# Patient Record
Sex: Female | Born: 2000 | Race: Black or African American | Hispanic: No | Marital: Single | Smoking: Never smoker
Health system: Southern US, Community
[De-identification: ages and names within clinical notes are randomized; demographics above are authoritative.]

## PROBLEM LIST (undated history)

## (undated) DIAGNOSIS — I10 Essential (primary) hypertension: Secondary | ICD-10-CM

## (undated) DIAGNOSIS — E039 Hypothyroidism, unspecified: Secondary | ICD-10-CM

## (undated) DIAGNOSIS — Z789 Other specified health status: Secondary | ICD-10-CM

## (undated) DIAGNOSIS — A609 Anogenital herpesviral infection, unspecified: Secondary | ICD-10-CM

## (undated) HISTORY — DX: Essential (primary) hypertension: I10

## (undated) HISTORY — PX: WISDOM TOOTH EXTRACTION: SHX21

## (undated) HISTORY — DX: Other specified health status: Z78.9

## (undated) HISTORY — PX: NO PAST SURGERIES: SHX2092

## (undated) HISTORY — DX: Hypothyroidism, unspecified: E03.9

## (undated) HISTORY — DX: Anogenital herpesviral infection, unspecified: A60.9

---

## 2020-10-18 ENCOUNTER — Encounter (HOSPITAL_COMMUNITY): Payer: Self-pay | Admitting: Emergency Medicine

## 2020-10-18 ENCOUNTER — Emergency Department (HOSPITAL_COMMUNITY): Payer: BC Managed Care – PPO

## 2020-10-18 ENCOUNTER — Emergency Department (HOSPITAL_COMMUNITY)
Admission: EM | Admit: 2020-10-18 | Discharge: 2020-10-18 | Disposition: A | Discharge status: Home / Self Care | Payer: BC Managed Care – PPO | Attending: Emergency Medicine | Admitting: Emergency Medicine

## 2020-10-18 DIAGNOSIS — N72 Inflammatory disease of cervix uteri: Secondary | ICD-10-CM | POA: Diagnosis not present

## 2020-10-18 DIAGNOSIS — N760 Acute vaginitis: Secondary | ICD-10-CM | POA: Insufficient documentation

## 2020-10-18 DIAGNOSIS — R102 Pelvic and perineal pain: Secondary | ICD-10-CM

## 2020-10-18 DIAGNOSIS — B9689 Other specified bacterial agents as the cause of diseases classified elsewhere: Secondary | ICD-10-CM

## 2020-10-18 LAB — URINALYSIS, ROUTINE W REFLEX MICROSCOPIC
Bilirubin Urine: NEGATIVE
Glucose, UA: NEGATIVE mg/dL
Hgb urine dipstick: NEGATIVE
Ketones, ur: NEGATIVE mg/dL
Nitrite: NEGATIVE
Protein, ur: NEGATIVE mg/dL
Specific Gravity, Urine: 1.021 (ref 1.005–1.030)
pH: 8 (ref 5.0–8.0)

## 2020-10-18 LAB — WET PREP, GENITAL
Sperm: NONE SEEN
Trich, Wet Prep: NONE SEEN
Yeast Wet Prep HPF POC: NONE SEEN

## 2020-10-18 LAB — PREGNANCY, URINE: Preg Test, Ur: NEGATIVE

## 2020-10-18 MED ORDER — DOXYCYCLINE HYCLATE 100 MG PO CAPS: 100.0000 mg | ORAL_CAPSULE | Freq: Two times a day (BID) | ORAL | 0 refills | Status: AC

## 2020-10-18 MED ORDER — METRONIDAZOLE 0.75 % VA GEL: 1.0000 | Freq: Once | VAGINAL | 0 refills | Status: AC

## 2020-10-18 MED ORDER — DOXYCYCLINE HYCLATE 100 MG PO TABS
100.0000 mg | ORAL_TABLET | Freq: Once | ORAL | Status: AC
  Administered 2020-10-18: 100 mg via ORAL
  Filled 2020-10-18: qty 1

## 2020-10-18 MED ORDER — STERILE WATER FOR INJECTION IJ SOLN
INTRAMUSCULAR | Status: AC
  Administered 2020-10-18: 2.1 mL
  Filled 2020-10-18: qty 10

## 2020-10-18 MED ORDER — CEFTRIAXONE SODIUM 1 G IJ SOLR
1.0000 g | Freq: Once | INTRAMUSCULAR | Status: AC
  Administered 2020-10-18: 1 g via INTRAMUSCULAR
  Filled 2020-10-18: qty 10

## 2020-10-18 NOTE — ED Triage Notes (Signed)
Patient reports pelvic pain x3 weeks. Denies vaginal discharge and vaginal bleeding. States pain mimics menstrual pain but has persisted after LMP.

## 2020-10-18 NOTE — ED Provider Notes (Signed)
[facility]-EMERGENCY DEPT Provider Note   CSN: [id number] Arrival date & time: 10/18/20  1425     History Chief Complaint  Patient presents with  . Pelvic Pain    Bethany Pierce is a 20 y.o. female.  She complains of central, suprapubic pain and left right and right low back pain.  She states that she gets some relief after urination.  No vaginal discharge.  She is sexually active, and she was diagnosed with chlamydia a few months ago.  She has had a retest which was negative.  The history is provided by the patient.  Pelvic Pain This is a new problem. The current episode started more than 1 week ago (3 weeks). The problem occurs constantly. The problem has been gradually worsening. Associated symptoms include abdominal pain. Pertinent negatives include no chest pain, no headaches and no shortness of breath. Nothing aggravates the symptoms. Relieved by: urination. She has tried nothing for the symptoms. The treatment provided no relief.       History reviewed. No pertinent past medical history.  There are no problems to display for this patient.   History reviewed. No pertinent surgical history.   OB History   No obstetric history on file.     No family history on file.     Home Medications Prior to Admission medications   Not on File    Allergies    Patient has no known allergies.  Review of Systems   Review of Systems  Constitutional: Negative for chills and fever.  HENT: Negative for ear pain and sore throat.   Eyes: Negative for pain and visual disturbance.  Respiratory: Negative for cough and shortness of breath.   Cardiovascular: Negative for chest pain and palpitations.  Gastrointestinal: Positive for abdominal pain. Negative for vomiting.  Genitourinary: Positive for pelvic pain. Negative for dysuria and hematuria.  Musculoskeletal: Negative for arthralgias and back pain.  Skin: Negative for color change and rash.  Neurological:  Negative for seizures, syncope and headaches.  All other systems reviewed and are negative.   Physical Exam Updated Vital Signs BP (!) 154/101 (BP Location: Left Wrist)   Pulse 86   Temp 98.3 F (36.8 C) (Oral)   Resp 20   LMP 10/11/2020   SpO2 99%   Physical Exam Vitals and nursing note reviewed.  HENT:     Head: Normocephalic and atraumatic.  Eyes:     General: No scleral icterus. Pulmonary:     Effort: Pulmonary effort is normal. No respiratory distress.  Abdominal:     Tenderness: There is abdominal tenderness in the suprapubic area. There is no right CVA tenderness, left CVA tenderness, guarding or rebound.  Genitourinary:    General: Normal vulva.     Vagina: Vaginal discharge (white) present.     Comments: Mild cervical tenderness and central uterine tenderness Musculoskeletal:     Cervical back: Normal range of motion.  Skin:    General: Skin is warm and dry.  Neurological:     Mental Status: She is alert.  Psychiatric:        Mood and Affect: Mood normal.     ED Results / Procedures / Treatments   Labs (all labs ordered are listed, but only abnormal results are displayed) Labs Reviewed  WET PREP, GENITAL - Abnormal; Notable for the following components:      Result Value   Clue Cells Wet Prep HPF POC PRESENT (*)    WBC, Wet Prep HPF POC PRESENT (*)  All other components within normal limits  URINALYSIS, ROUTINE W REFLEX MICROSCOPIC - Abnormal; Notable for the following components:   APPearance HAZY (*)    Leukocytes,Ua MODERATE (*)    Bacteria, UA RARE (*)    All other components within normal limits  PREGNANCY, URINE  GC/CHLAMYDIA PROBE AMP ([place]) NOT AT [facility]    EKG None  Radiology US Transvaginal Non-OB  Result Date: 10/18/2020 CLINICAL DATA:  Pelvic pain for several weeks. EXAM: TRANSABDOMINAL AND TRANSVAGINAL ULTRASOUND OF PELVIS TECHNIQUE: Both transabdominal and transvaginal ultrasound examinations of the pelvis were performed.  Transabdominal technique was performed for global imaging of the pelvis including uterus, ovaries, adnexal regions, and pelvic cul-de-sac. It was necessary to proceed with endovaginal exam following the transabdominal exam to visualize the uterus, endometrium, bilateral ovaries and bilateral adnexa. COMPARISON:  None FINDINGS: Uterus Measurements: 7.9 cm x 3.3 cm x 4.1 cm = volume: 54.9 mL. No fibroids or other mass visualized. Endometrium Thickness: 3.2 mm.  No focal abnormality visualized. Right ovary Measurements: 2.1 cm x 1.9 cm x 2.0 cm = volume: 4.1 mL. Normal appearance/no adnexal mass. Left ovary The left ovary is not visualized. Other findings The study is limited secondary to the patient's body habitus and overlying bowel gas. IMPRESSION: 1. Limited study with nonvisualization of the left ovary. 2. Otherwise, unremarkable ultrasonographic evaluation of the pelvis. Electronically Signed   By: Aram Candela M.D.   On: 10/18/2020 19:41   US Pelvis Complete  Result Date: 10/18/2020 CLINICAL DATA:  Pelvic pain for several weeks. EXAM: TRANSABDOMINAL AND TRANSVAGINAL ULTRASOUND OF PELVIS TECHNIQUE: Both transabdominal and transvaginal ultrasound examinations of the pelvis were performed. Transabdominal technique was performed for global imaging of the pelvis including uterus, ovaries, adnexal regions, and pelvic cul-de-sac. It was necessary to proceed with endovaginal exam following the transabdominal exam to visualize the uterus, endometrium, bilateral ovaries and bilateral adnexa. COMPARISON:  None FINDINGS: Uterus Measurements: 7.9 cm x 3.3 cm x 4.1 cm = volume: 54.9 mL. No fibroids or other mass visualized. Endometrium Thickness: 3.2 mm.  No focal abnormality visualized. Right ovary Measurements: 2.1 cm x 1.9 cm x 2.0 cm = volume: 4.1 mL. Normal appearance/no adnexal mass. Left ovary The left ovary is not visualized. Other findings The study is limited secondary to the patient's body habitus and  overlying bowel gas. IMPRESSION: 1. Limited study with nonvisualization of the left ovary. 2. Otherwise, unremarkable ultrasonographic evaluation of the pelvis. Electronically Signed   By: Aram Candela M.D.   On: 10/18/2020 19:40    Procedures Procedures   Medications Ordered in ED Medications - No data to display  ED Course  I have reviewed the triage vital signs and the nursing notes.  Pertinent labs & imaging results that were available during my care of the patient were reviewed by me and considered in my medical decision making (see chart for details).    MDM Rules/Calculators/A&P                          The patient is well-appearing and otherwise healthy.  She presents with pelvic pain and low back pain ongoing for about 3 weeks.  She has a history of chlamydia.  Abdominal exam was overall very benign.  Pelvic ultrasound was performed to identify any evidence of pelvic inflammatory disease or much less likely ovarian torsion.  Although her left ovary was not visualized, I am unconcerned about torsion on the side.  Pain was more  central.  The patient has an equivocal urinalysis, and at this point I have not elected to treat her.  She had mild cervical motion tenderness, and based on her history of having chlamydia, I wanted to empirically treat her for cervicitis.  I have also recommended treatment for bacterial vaginitis.  She was given instructions on abstaining sexual activity until she is fully treated and having a recheck in 3 months. Final Clinical Impression(s) / ED Diagnoses Final diagnoses:  Pelvic pain in female  BV (bacterial vaginosis)  Cervicitis    Rx / DC Orders ED Discharge Orders         Ordered    metroNIDAZOLE (METROGEL VAGINAL) 0.75 % vaginal gel   Once        10/18/20 2003    doxycycline (VIBRAMYCIN) 100 MG capsule  2 times daily        10/18/20 2003           Koleen Distance, MD 10/18/20 2007

## 2020-10-20 LAB — GC/CHLAMYDIA PROBE AMP (~~LOC~~) NOT AT ARMC
Chlamydia: POSITIVE — AB
Comment: NEGATIVE
Comment: NORMAL
Neisseria Gonorrhea: NEGATIVE

## 2021-03-06 ENCOUNTER — Encounter (HOSPITAL_COMMUNITY): Payer: Self-pay

## 2021-03-06 ENCOUNTER — Emergency Department (HOSPITAL_COMMUNITY)
Admission: EM | Admit: 2021-03-06 | Discharge: 2021-03-07 | Discharge status: Against Medical Advice | Payer: BC Managed Care – PPO | Attending: Emergency Medicine | Admitting: Emergency Medicine

## 2021-03-06 ENCOUNTER — Other Ambulatory Visit: Payer: Self-pay

## 2021-03-06 ENCOUNTER — Telehealth: Payer: Self-pay

## 2021-03-06 DIAGNOSIS — R102 Pelvic and perineal pain: Secondary | ICD-10-CM | POA: Diagnosis not present

## 2021-03-06 DIAGNOSIS — R1110 Vomiting, unspecified: Secondary | ICD-10-CM | POA: Diagnosis not present

## 2021-03-06 DIAGNOSIS — Z5321 Procedure and treatment not carried out due to patient leaving prior to being seen by health care provider: Secondary | ICD-10-CM | POA: Diagnosis not present

## 2021-03-06 DIAGNOSIS — N898 Other specified noninflammatory disorders of vagina: Secondary | ICD-10-CM | POA: Diagnosis not present

## 2021-03-06 NOTE — ED Triage Notes (Signed)
Patient c/o pelvic pain, emesis, yellow/white vaginal discharge, and a vaginal odor x 1 week.

## 2021-03-06 NOTE — ED Notes (Signed)
Pt called to update vital signs with no answer. 

## 2021-03-06 NOTE — ED Provider Notes (Signed)
Emergency Medicine Provider Triage Evaluation Note  Bethany Pierce , a 20 y.o. female  was evaluated in triage.  Pt complains of pelvic pain.  Review of Systems  Positive: Pelvic pain, vaginal discharge and odor, nausea Negative: Fever, abd pain, dysuria  Physical Exam  BP (!) 156/101 (BP Location: Left Arm)   Pulse (!) 107   Temp 98.5 F (36.9 C) (Oral)   Resp 18   Ht 5\' 6"  (1.676 m)   Wt (!) 180.5 kg   LMP 02/17/2021 (Exact Date)   SpO2 92%   BMI 64.24 kg/m  Gen:   Awake, no distress   Resp:  Normal effort  MSK:   Moves extremities without difficulty  Other:    Medical Decision Making  Medically screening exam initiated at 4:02 PM.  Appropriate orders placed.  Temia Debroux was informed that the remainder of the evaluation will be completed by another provider, this initial triage assessment does not replace that evaluation, and the importance of remaining in the ED until their evaluation is complete.  Patient reported having vaginal odor and discharge along with nausea and some pelvic discomfort and for the past several days.  Recently found out that her boyfriend test positive for chlamydia infection which concerns her.  Last menstrual period was 7/12.   9/12, PA-C 03/06/21 1614    03/08/21, MD 03/06/21 [id number]

## 2021-03-06 NOTE — Telephone Encounter (Signed)
Pts mother Blake Divine is trying to get a hold of someone at [facility] to figure out was is going on with the pt after being admitted to the ED Please advise CB- [phone]

## 2021-03-06 NOTE — ED Notes (Signed)
Pt was called to update vital signs but no response. x2 

## 2021-03-07 NOTE — ED Notes (Signed)
Pt called for lab work no response

## 2021-05-08 ENCOUNTER — Other Ambulatory Visit: Payer: Self-pay

## 2021-05-08 ENCOUNTER — Emergency Department (HOSPITAL_COMMUNITY)
Admission: EM | Admit: 2021-05-08 | Discharge: 2021-05-09 | Disposition: A | Discharge status: Home / Self Care | Payer: Medicaid Other | Attending: Emergency Medicine | Admitting: Emergency Medicine

## 2021-05-08 DIAGNOSIS — Z113 Encounter for screening for infections with a predominantly sexual mode of transmission: Secondary | ICD-10-CM | POA: Insufficient documentation

## 2021-05-08 DIAGNOSIS — R300 Dysuria: Secondary | ICD-10-CM | POA: Diagnosis present

## 2021-05-08 DIAGNOSIS — R102 Pelvic and perineal pain: Secondary | ICD-10-CM | POA: Diagnosis not present

## 2021-05-08 DIAGNOSIS — N760 Acute vaginitis: Secondary | ICD-10-CM | POA: Diagnosis not present

## 2021-05-08 DIAGNOSIS — N3090 Cystitis, unspecified without hematuria: Secondary | ICD-10-CM | POA: Insufficient documentation

## 2021-05-08 DIAGNOSIS — B9689 Other specified bacterial agents as the cause of diseases classified elsewhere: Secondary | ICD-10-CM | POA: Insufficient documentation

## 2021-05-08 LAB — URINALYSIS, ROUTINE W REFLEX MICROSCOPIC
Bilirubin Urine: NEGATIVE
Glucose, UA: NEGATIVE mg/dL
Hgb urine dipstick: NEGATIVE
Ketones, ur: 5 mg/dL — AB
Nitrite: POSITIVE — AB
Protein, ur: 100 mg/dL — AB
RBC / HPF: >50 RBC/hpf — ABNORMAL HIGH (ref 0–5)
Specific Gravity, Urine: 1.032 — ABNORMAL HIGH (ref 1.005–1.030)
WBC, UA: >50 WBC/hpf — ABNORMAL HIGH (ref 0–5)
pH: 5 (ref 5.0–8.0)

## 2021-05-08 LAB — WET PREP, GENITAL
Sperm: NONE SEEN
Trich, Wet Prep: NONE SEEN
Yeast Wet Prep HPF POC: NONE SEEN

## 2021-05-08 MED ORDER — CEFTRIAXONE SODIUM 1 G IJ SOLR
1.0000 g | Freq: Once | INTRAMUSCULAR | Status: AC
  Administered 2021-05-08: 1 g via INTRAMUSCULAR
  Filled 2021-05-08: qty 10

## 2021-05-08 MED ORDER — DOXYCYCLINE HYCLATE 100 MG PO TABS: 100.0000 mg | ORAL_TABLET | Freq: Two times a day (BID) | ORAL | 0 refills | Status: DC

## 2021-05-08 MED ORDER — LIDOCAINE HCL (PF) 1 % IJ SOLN
2.1000 mL | Freq: Once | INTRAMUSCULAR | Status: AC
  Administered 2021-05-08: 2.1 mL
  Filled 2021-05-08: qty 30

## 2021-05-08 NOTE — ED Provider Notes (Signed)
Emergency Medicine Provider Triage Evaluation Note  Bethany Pierce , a 20 y.o. female  was evaluated in triage.  Pt complains of dysuria and urinary urgency.  Patient reports that her symptoms started yesterday.  Patient reports that her sexual partner may also have been positive for sexually transmitted infection.  Review of Systems  Positive: Dysuria, urinary urgency Negative: Vaginal pain, vaginal bleeding, vaginal discharge, genital sores or lesions, hematuria, decreased urinary output  Physical Exam  BP (!) 147/100 (BP Location: Left Arm)   Pulse 75   Temp 98.1 F (36.7 C) (Oral)   Resp 16   Ht 5\' 6"  (1.676 m)   Wt (!) 183.7 kg   SpO2 100%   BMI 65.37 kg/m  Gen:   Awake, no distress   Resp:  Normal effort  MSK:   Moves extremities without difficulty  Other:  Abdomen soft, nondistended, nontender, no peritoneal signs.  Medical Decision Making  Medically screening exam initiated at 9:40 PM.  Appropriate orders placed.  Bethany Pierce was informed that the remainder of the evaluation will be completed by another provider, this initial triage assessment does not replace that evaluation, and the importance of remaining in the ED until their evaluation is complete.  The patient appears stable so that the remainder of the work up may be completed by another provider.      Bethany Pierce 05/08/21 2141    2142, MD 05/09/21 1105

## 2021-05-08 NOTE — ED Provider Notes (Signed)
[facility]-EMERGENCY DEPT Provider Note   CSN: [id number] Arrival date & time: 05/08/21  2130     History Chief Complaint  Patient presents with   Dysuria   SEXUALLY TRANSMITTED DISEASE    Bethany Pierce is a 20 y.o. female with no significant past medical history reports with several days of dysuria, urinary urgency.  Patient reports that she had unprotected sex with 1 female sexual partner around 3 days ago.  Patient reports that her sexual partner told her that she should get tested.  Patient reports that her sexual partner denied any STI, denied any symptoms.  Patient denies dyspareunia, vaginal discharge, vaginal bleeding, vaginal itching.  Patient has no history of STIs.  Patient just would like to be tested at this time.  She reports last menstrual period was 17 September.  Patient reports that she normally does use condoms, denies any other sexual partners.  Patient denies nausea, vomiting, abdominal pain, fever, chills.   Dysuria     No past medical history on file.  There are no problems to display for this patient.   No past surgical history on file.   OB History   No obstetric history on file.     Family History  Problem Relation Age of Onset   Diabetes Mother    Hypertension Father     Social History   Tobacco Use   Smoking status: Never   Smokeless tobacco: Never  Vaping Use   Vaping Use: Never used  Substance Use Topics   Alcohol use: Never   Drug use: Never    Home Medications Prior to Admission medications   Medication Sig Start Date End Date Taking? Authorizing Provider  doxycycline (VIBRA-TABS) 100 MG tablet Take 1 tablet (100 mg total) by mouth 2 (two) times daily. 05/08/21  Yes Dorlisa Savino H, PA-C    Allergies    Patient has no known allergies.  Review of Systems   Review of Systems  Genitourinary:  Positive for dysuria and urgency.  All other systems reviewed and are negative.  Physical Exam Updated  Vital Signs BP (!) 147/100 (BP Location: Left Arm)   Pulse 75   Temp 98.1 F (36.7 C) (Oral)   Resp 16   Ht 5\' 6"  (1.676 m)   Wt (!) 183.7 kg   SpO2 100%   BMI 65.37 kg/m   Physical Exam Vitals and nursing note reviewed.  Constitutional:      General: She is not in acute distress.    Appearance: Normal appearance. She is obese.  HENT:     Head: Normocephalic and atraumatic.  Eyes:     General:        Right eye: No discharge.        Left eye: No discharge.  Cardiovascular:     Rate and Rhythm: Normal rate and regular rhythm.  Pulmonary:     Effort: Pulmonary effort is normal. No respiratory distress.  Abdominal:     Comments: Some tenderness to palpation in suprapubic region no tenderness to palpation throughout the rest of the abdomen.  Genitourinary:    Comments: Vulva normal limits, cervix is nulliparous.  Scant amount of clear to whitish cervical discharge.  No cervical motion tenderness.  No adnexal tenderness. Musculoskeletal:        General: No deformity.  Skin:    General: Skin is warm and dry.  Neurological:     Mental Status: She is alert and oriented to person, place, and time.  Psychiatric:  Mood and Affect: Mood normal.        Behavior: Behavior normal.    ED Results / Procedures / Treatments   Labs (all labs ordered are listed, but only abnormal results are displayed) Labs Reviewed  WET PREP, GENITAL - Abnormal; Notable for the following components:      Result Value   Clue Cells Wet Prep HPF POC PRESENT (*)    WBC, Wet Prep HPF POC FEW (*)    All other components within normal limits  URINALYSIS, ROUTINE W REFLEX MICROSCOPIC - Abnormal; Notable for the following components:   APPearance CLOUDY (*)    Specific Gravity, Urine 1.032 (*)    Ketones, ur 5 (*)    Protein, ur 100 (*)    Nitrite POSITIVE (*)    Leukocytes,Ua LARGE (*)    RBC / HPF >50 (*)    WBC, UA >50 (*)    Bacteria, UA MANY (*)    All other components within normal limits   RPR  HIV ANTIBODY (ROUTINE TESTING W REFLEX)  RAPID HIV SCREEN (HIV 1/2 AB+AG)  I-STAT BETA HCG BLOOD, ED ([facility], [facility], [facility] ONLY)  GC/CHLAMYDIA PROBE AMP ([place]) NOT AT [facility]    EKG None  Radiology No results found.  Procedures Procedures   Medications Ordered in ED Medications  cefTRIAXone (ROCEPHIN) injection 1 g (1 g Intramuscular Given 05/08/21 2245)  lidocaine (PF) (XYLOCAINE) 1 % injection 2.1 mL (2.1 mLs Other Given 05/08/21 2245)    ED Course  I have reviewed the triage vital signs and the nursing notes.  Pertinent labs & imaging results that were available during my care of the patient were reviewed by me and considered in my medical decision making (see chart for details).    MDM Rules/Calculators/A&P                         Pelvic exam revealed nulliparous cervix. Scant amount of colorless to whitish discharge.  No cervical motion tenderness.  Some suprapubic tenderness.  Patient does not have significant adnexal tenderness, is not febrile, denies fevers, chills.  Low clinical suspicion for tubo-ovarian abscess at this time.  Beta-hCG negative, low clinical suspicion for ectopic pregnancy at this time.  Discussed in absence of significant symptoms, CMT, or discharge could wait until results are published in order to treat for STIs.  Patient reports that she would rather presumptively treat at this time.  We will treat for GC, chlamydia.  Wet prep does not show trichomonas but does show BV.  Urinalysis shows nitrite, leukocyte positive urinary tract infection.  Recommended patient to not engage in sexual activity until all of her symptoms resolved.  Discussed she should inform all of her sexual partners.  Discussed she can check her results on her chart in 24 to 48 hours.  Recommended follow-up at [facility] as needed based on results.  Antibiotics pending on pregnancy test.  12:20 AM Care of Bethany Pierce transferred to [facility] and Dr. Wilkie Aye  at the end of my shift as the patient will require reassessment once labs/imaging have resulted. Patient presentation, ED course, and plan of care discussed with review of all pertinent labs and imaging. Please see his/her note for further details regarding further ED course and disposition. Plan at time of handoff is DC with antibiotics for BV, UTI, presumptive GC/Chlam. This may be altered or completely changed at the discretion of the oncoming team pending results of further  workup.  Final Clinical Impression(s) / ED Diagnoses Final diagnoses:  Dysuria  Encounter for screening examination for sexually transmitted disease  BV (bacterial vaginosis)  Cystitis    Rx / DC Orders ED Discharge Orders          Ordered    doxycycline (VIBRA-TABS) 100 MG tablet  2 times daily        05/08/21 2228             [place] 05/09/21 Doreen Beam, MD 05/11/21 1450

## 2021-05-08 NOTE — ED Triage Notes (Signed)
Pt came from home via POV. C/c: dysuria with STD check. Pt denies vaginal bleeding, odor, or abnormal discharge. Pt states burning upon urination began yesterday, worsening today.

## 2021-05-09 ENCOUNTER — Telehealth (HOSPITAL_COMMUNITY): Payer: Self-pay | Admitting: Emergency Medicine

## 2021-05-09 LAB — HIV ANTIBODY (ROUTINE TESTING W REFLEX): HIV Screen 4th Generation wRfx: NONREACTIVE

## 2021-05-09 LAB — RAPID HIV SCREEN (HIV 1/2 AB+AG)
HIV 1/2 Antibodies: NONREACTIVE
HIV-1 P24 Antigen - HIV24: NONREACTIVE

## 2021-05-09 LAB — RPR: RPR Ser Ql: NONREACTIVE

## 2021-05-09 LAB — I-STAT BETA HCG BLOOD, ED (MC, WL, AP ONLY): I-stat hCG, quantitative: <5 m[IU]/mL (ref <5)

## 2021-05-09 MED ORDER — CEPHALEXIN 500 MG PO CAPS: 500.0000 mg | ORAL_CAPSULE | Freq: Four times a day (QID) | ORAL | 0 refills | Status: AC

## 2021-05-09 MED ORDER — CEPHALEXIN 500 MG PO CAPS: 500.0000 mg | ORAL_CAPSULE | Freq: Four times a day (QID) | ORAL | 0 refills | Status: DC

## 2021-05-09 MED ORDER — DOXYCYCLINE HYCLATE 100 MG PO CAPS: 100.0000 mg | ORAL_CAPSULE | Freq: Two times a day (BID) | ORAL | 0 refills | Status: DC

## 2021-05-09 MED ORDER — DOXYCYCLINE HYCLATE 100 MG PO TABS: 100.0000 mg | ORAL_TABLET | Freq: Two times a day (BID) | ORAL | 0 refills | Status: DC

## 2021-05-09 MED ORDER — METRONIDAZOLE 500 MG PO TABS: 500.0000 mg | ORAL_TABLET | Freq: Two times a day (BID) | ORAL | 0 refills | Status: DC

## 2021-05-09 NOTE — ED Provider Notes (Signed)
20 year old female received in signout from [place] Prosperi pending pregnancy test. Per his HPI:   "Bethany Pierce is a 20 y.o. female with no significant past medical history reports with several days of dysuria, urinary urgency.  Patient reports that she had unprotected sex with 1 female sexual partner around 3 days ago.  Patient reports that her sexual partner told her that she should get tested.  Patient reports that her sexual partner denied any STI, denied any symptoms.  Patient denies dyspareunia, vaginal discharge, vaginal bleeding, vaginal itching.  Patient has no history of STIs.  Patient just would like to be tested at this time.  She reports last menstrual period was 17 September.  Patient reports that she normally does use condoms, denies any other sexual partners.  Patient denies nausea, vomiting, abdominal pain, fever, chills."   Physical Exam  BP (!) 147/100 (BP Location: Left Arm)   Pulse 75   Temp 98.1 F (36.7 C) (Oral)   Resp 16   Ht 5\' 6"  (1.676 m)   Wt (!) 183.7 kg   SpO2 100%   BMI 65.37 kg/m   Physical Exam Vitals and nursing note reviewed.  Constitutional:      Appearance: She is well-developed.     Comments: Sitting on the edge of the bed.  No acute distress.  HENT:     Head: Normocephalic and atraumatic.  Cardiovascular:     Rate and Rhythm: Normal rate.  Pulmonary:     Effort: Pulmonary effort is normal.  Musculoskeletal:        General: Normal range of motion.     Cervical back: Normal range of motion.  Neurological:     General: No focal deficit present.     Mental Status: She is alert.    ED Course/Procedures     Procedures  MDM  20 year old female received in signout from 36 Prosperi pending pregnancy test.  Please see his note for further work-up and medical decision making.  In brief, this is a patient presenting with abdominal pain who recently had unprotected sexual intercourse with a new partner.  There were initial concerns for STI.  However,  her work-up was suggestive of cystitis.  GC and Chlamydia are pending.  She was given Rocephin prophylactically for gonorrhea in the emergency department.  Labs of been reviewed and independently interpreted by me.  Pregnancy test is negative.  Discussed results with the patient.  Will start on Keflex for cystitis.  Urine culture has been sent.  Regarding chlamydia treatment.  The patient has a MyChart account.  Discussed that treatment includes a 7-day course of doxycycline.  Discussed that she will also need treatment for bacterial vaginosis with Flagyl.  Both Flagyl and Keflex have been sent electronically to her pharmacy.  We will give the patient a paper prescription for doxycycline that she can fill if her chlamydia test is positive.  Discussed that she can not get this medication filled if it is negative.  Patient is in agreement with this plan.  She is hemodynamically stable in no acute distress.  ER return precautions given.  Safer discharge home with outpatient follow-up as discussed.       [place] A, PA-C 05/09/21 0245    07/09/21, MD 05/09/21 2316

## 2021-05-09 NOTE — Telephone Encounter (Signed)
Needed repeat prescriptions due to power outage

## 2021-05-09 NOTE — Discharge Instructions (Addendum)
Thank you for allowing me to care for you today in the Emergency Department.   You were diagnosed with a urinary tract infection today.  Take 1 tablet of Keflex every 6 hours for the next 5 days.  On your pelvic exam, you did also have bacterial vaginosis, which is not an STD.  Treat this by taking 1 tablet of Flagyl 2 times daily for the next 7 days.  It is very important you avoid alcohol while taking this medication.  Since your concerned about an STD exposure, you were treated with Rocephin today for gonorrhea.  I have given you a prescription for doxycycline if your chlamydia test is positive.  As we discussed, it takes about 24 hours for the test results to come back.  Since you have MyChart downloaded, you can view the results in the app.  If the chlamydia test is positive, you should fill the prescription for doxycycline and take as directed.  If either your gonorrhea or chlamydia test are positive, you need to let all sexual partners know.  You also need to avoid all sexual activities for 7 days after you finish treatment, which would be 14 days from today.   Take 650 mg of Tylenol or 600 mg of ibuprofen with food every 6 hours for pain.  You can alternate between these 2 medications every 3 hours if your pain returns.  For instance, you can take Tylenol at noon, followed by a dose of ibuprofen at 3, followed by second dose of Tylenol and 6.  Return to the emergency department if you have severe, uncontrollable pain, if you start having fever, temperature 100.4 F or higher several days after you have been taking antibiotics, or other new, concerning symptoms.

## 2021-05-11 LAB — GC/CHLAMYDIA PROBE AMP (~~LOC~~) NOT AT ARMC
Chlamydia: NEGATIVE
Comment: NEGATIVE
Comment: NORMAL
Neisseria Gonorrhea: NEGATIVE

## 2022-01-02 IMAGING — US US PELVIS COMPLETE
1 series · 14 of 25 positions shown · non-contrast
Comparison: None

CLINICAL DATA: Pelvic pain for several weeks.

EXAM:
TRANSABDOMINAL AND TRANSVAGINAL ULTRASOUND OF PELVIS
TECHNIQUE: Both transabdominal and transvaginal ultrasound examinations of the
pelvis were performed. Transabdominal technique was performed for
global imaging of the pelvis including uterus, ovaries, adnexal
regions, and pelvic cul-de-sac. It was necessary to proceed with
endovaginal exam following the transabdominal exam to visualize the
uterus, endometrium, bilateral ovaries and bilateral adnexa.

[Series 1: us pelvis complete · 14 of 65 slices shown]
[im 1/65]
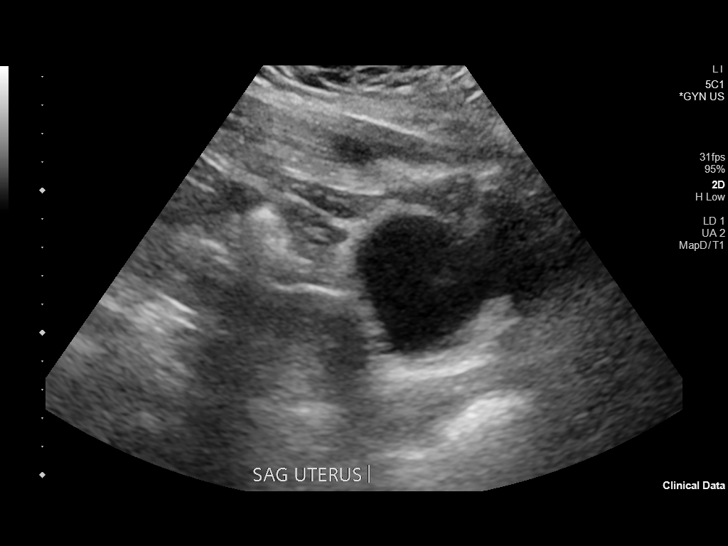
[im 6/65]
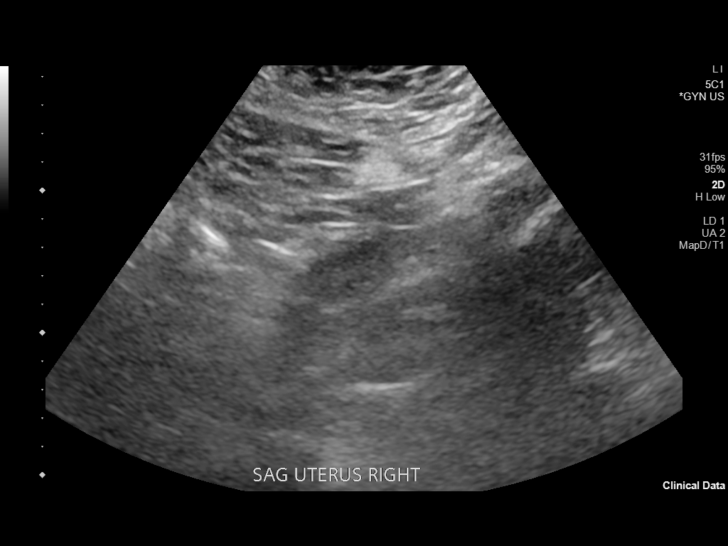
[im 11/65]
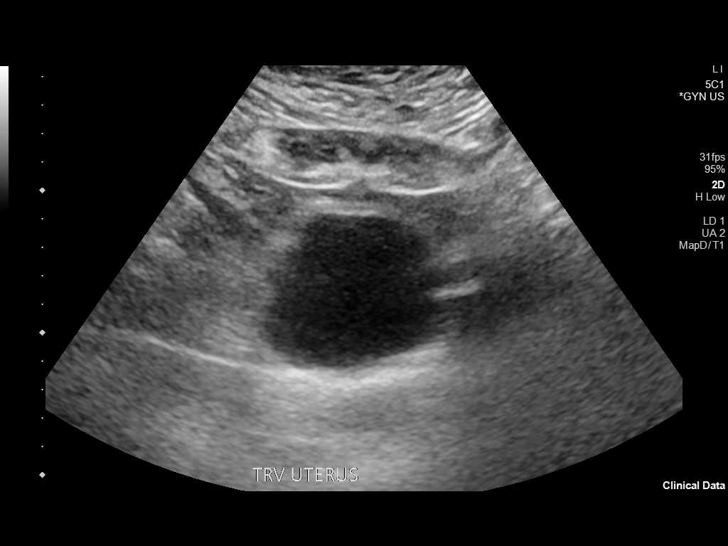
[im 17/65]
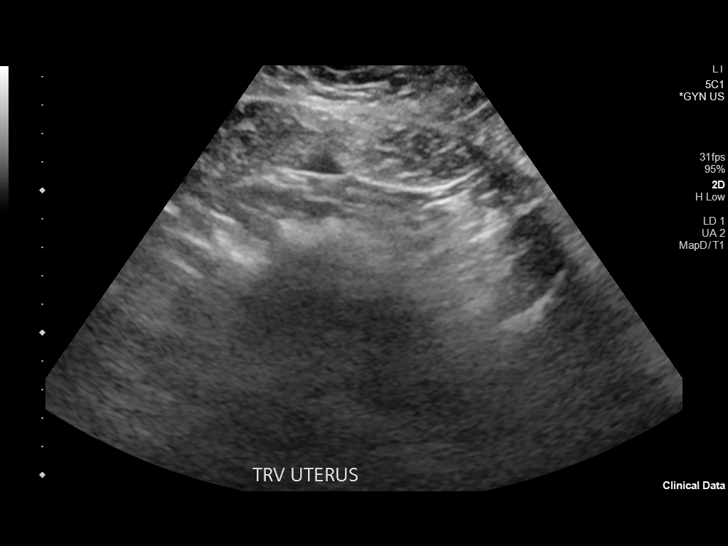
[im 22/65]
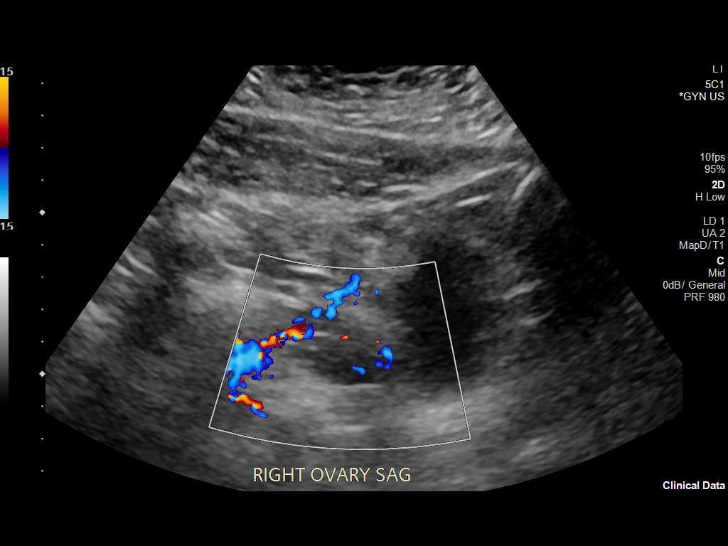
[im 25/65]
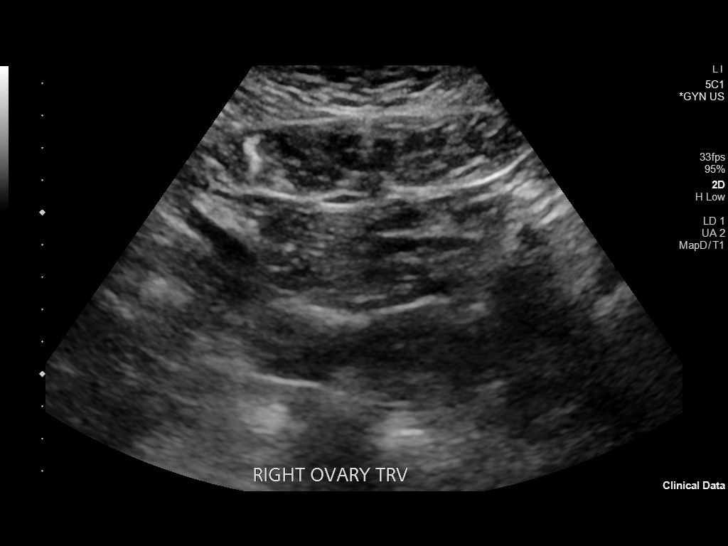
[im 30/65]
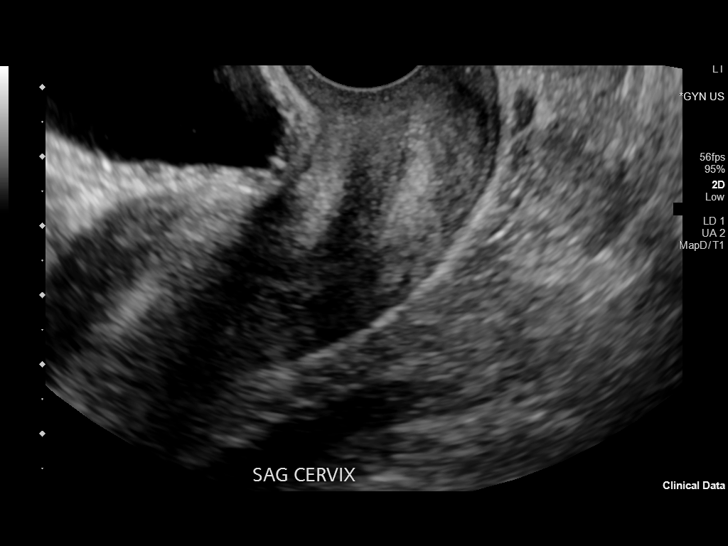
[im 35/65]
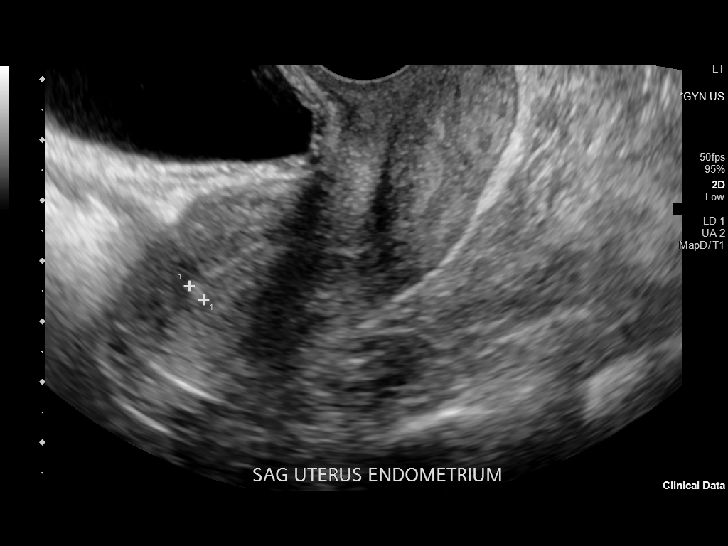
[im 41/65]
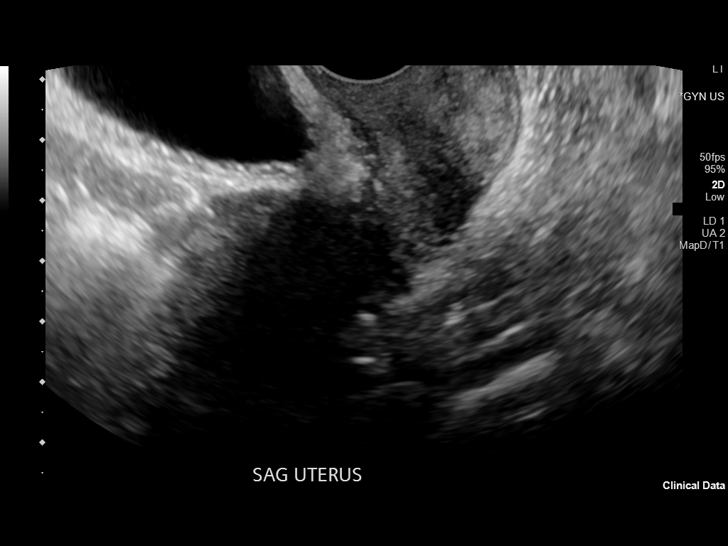
[im 43/65]
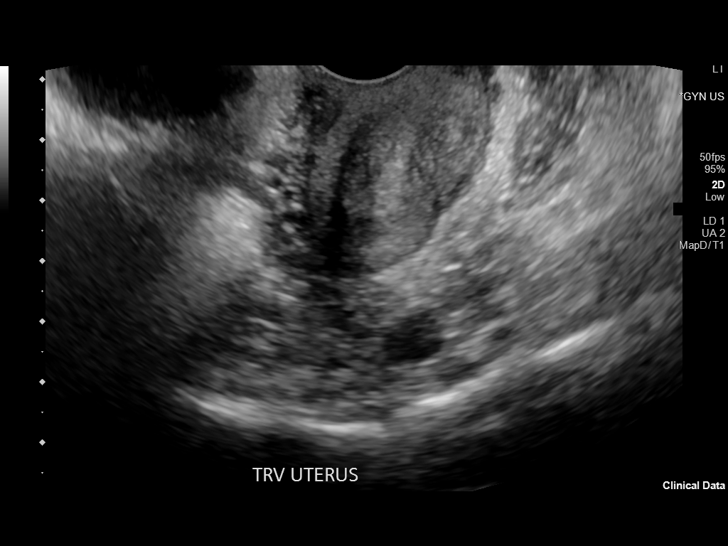
[im 49/65]
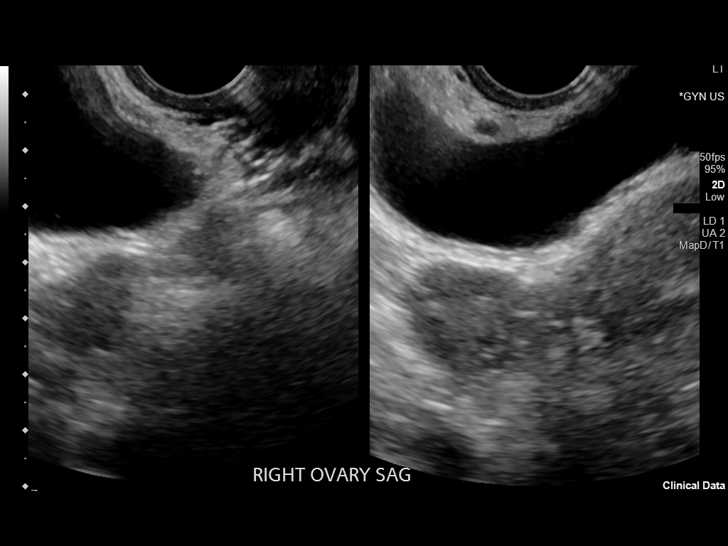
[im 54/65]
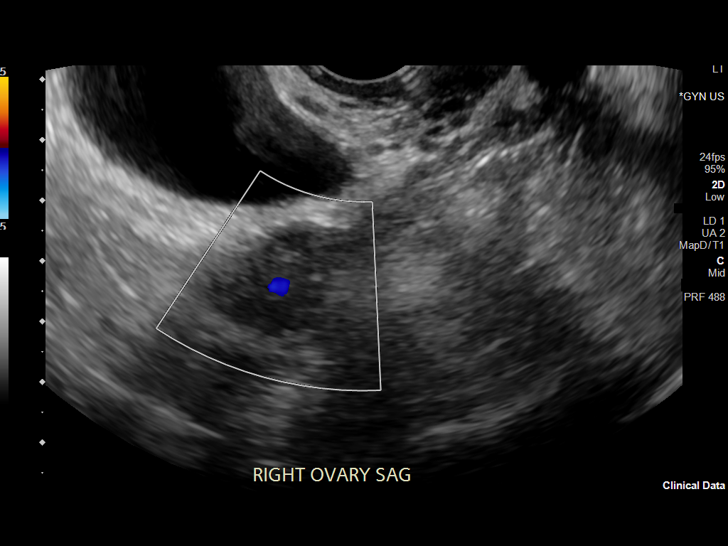
[im 59/65]
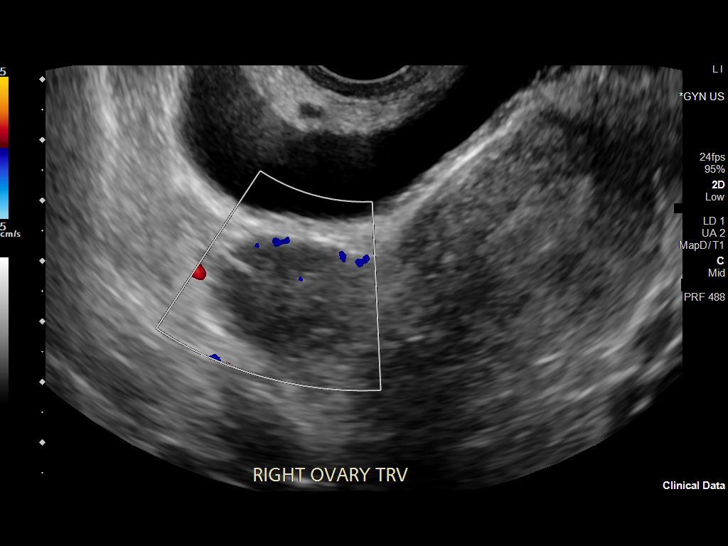
[im 65/65]
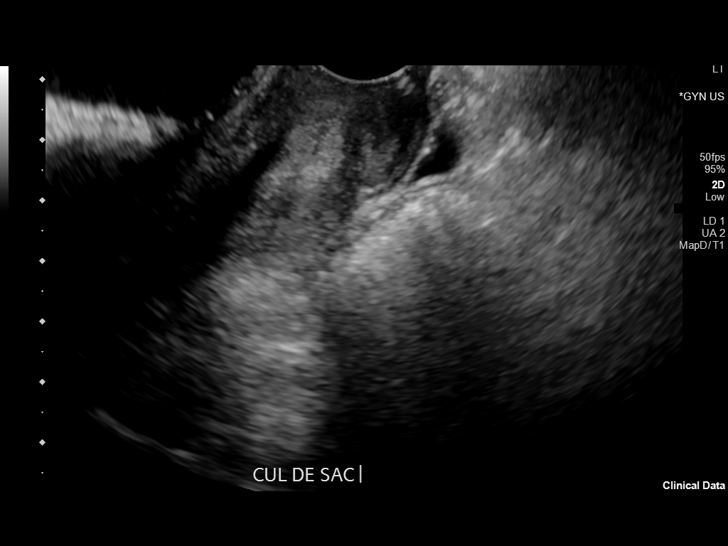

[14 of 25 positions shown; findings below may reference images not displayed]

FINDINGS: Uterus

Measurements: 7.9 cm x 3.3 cm x 4.1 cm = volume: 54.9 mL. No
fibroids or other mass visualized.

Endometrium

Thickness: 3.2 mm.  No focal abnormality visualized.

Right ovary

Measurements: 2.1 cm x 1.9 cm x 2.0 cm = volume: 4.1 mL. Normal
appearance/no adnexal mass.

Left ovary

The left ovary is not visualized.

Other findings

The study is limited secondary to the patient's body habitus and
overlying bowel gas.
IMPRESSION: 1. Limited study with nonvisualization of the left ovary.
2. Otherwise, unremarkable ultrasonographic evaluation of the
pelvis.

## 2022-01-02 IMAGING — US US TRANSVAGINAL NON-OB
1 series · 14 of 25 positions shown · non-contrast
Comparison: None

CLINICAL DATA: Pelvic pain for several weeks.

EXAM:
TRANSABDOMINAL AND TRANSVAGINAL ULTRASOUND OF PELVIS
TECHNIQUE: Both transabdominal and transvaginal ultrasound examinations of the
pelvis were performed. Transabdominal technique was performed for
global imaging of the pelvis including uterus, ovaries, adnexal
regions, and pelvic cul-de-sac. It was necessary to proceed with
endovaginal exam following the transabdominal exam to visualize the
uterus, endometrium, bilateral ovaries and bilateral adnexa.

[Series 1: us transvaginal non-ob · 14 of 65 slices shown]
[im 1/65]
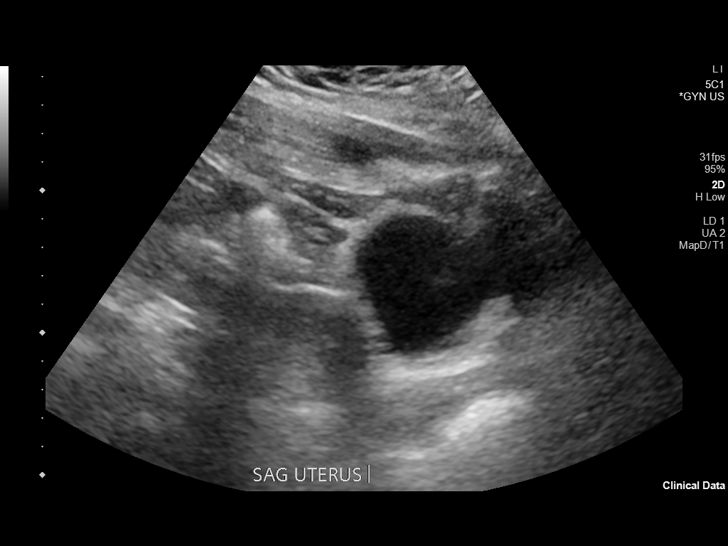
[im 6/65]
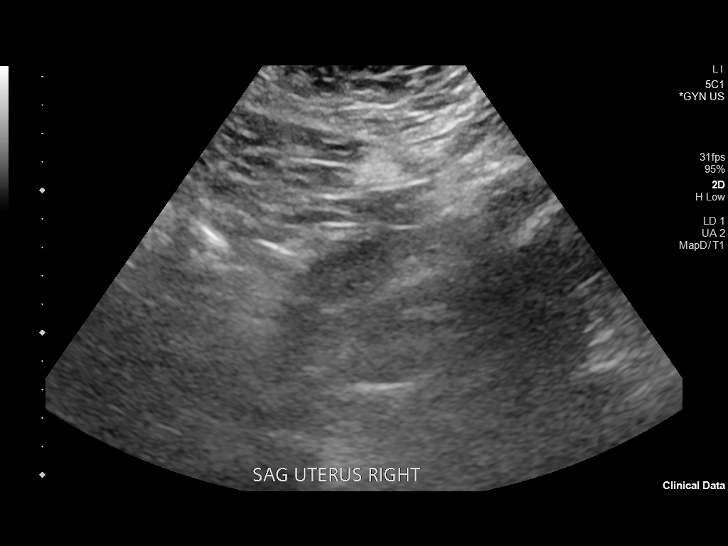
[im 11/65]
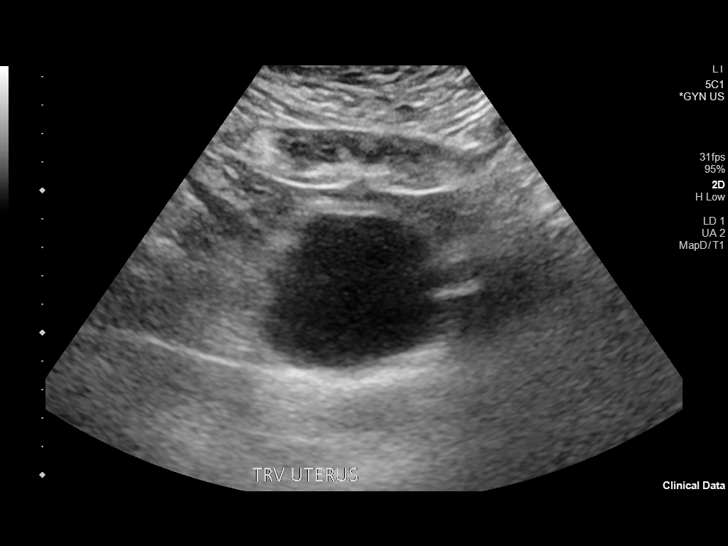
[im 17/65]
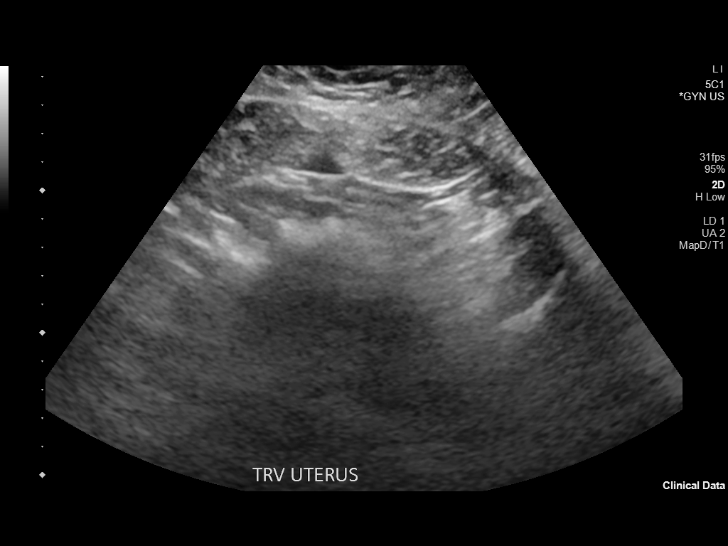
[im 22/65]
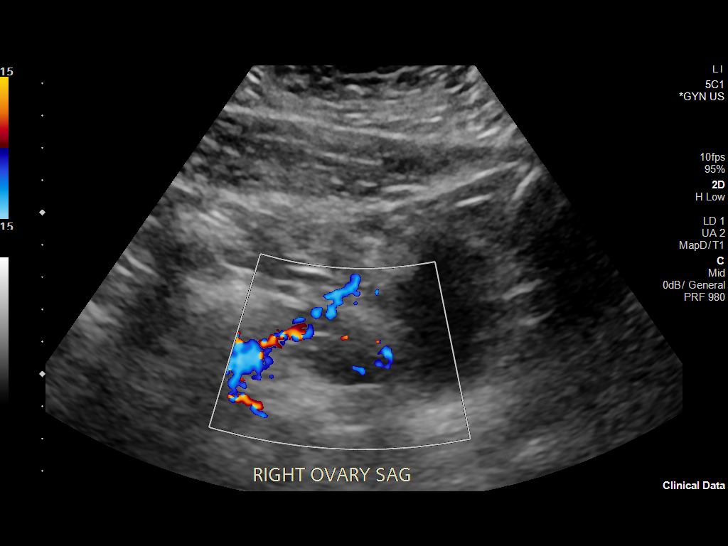
[im 25/65]
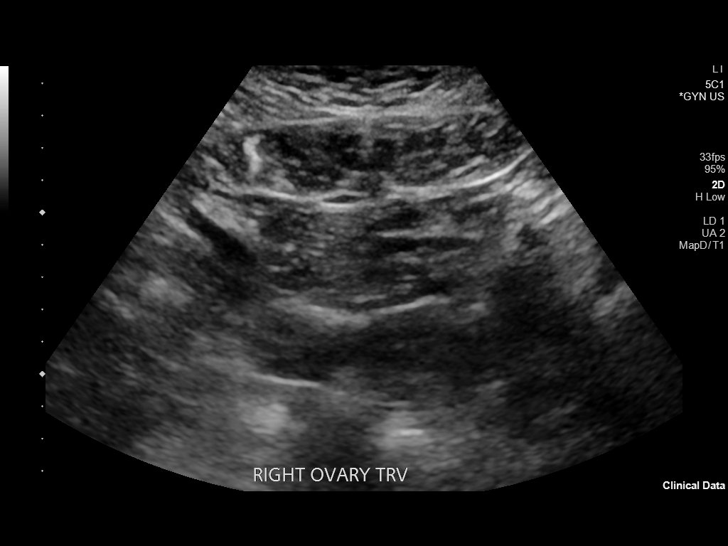
[im 30/65]
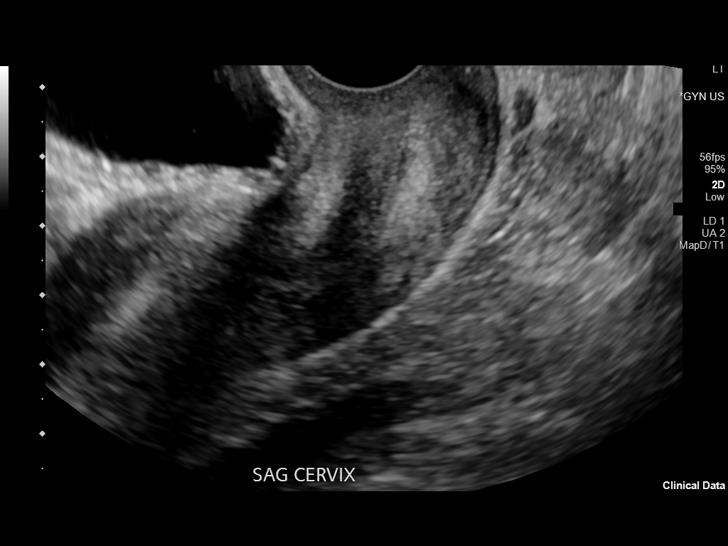
[im 35/65]
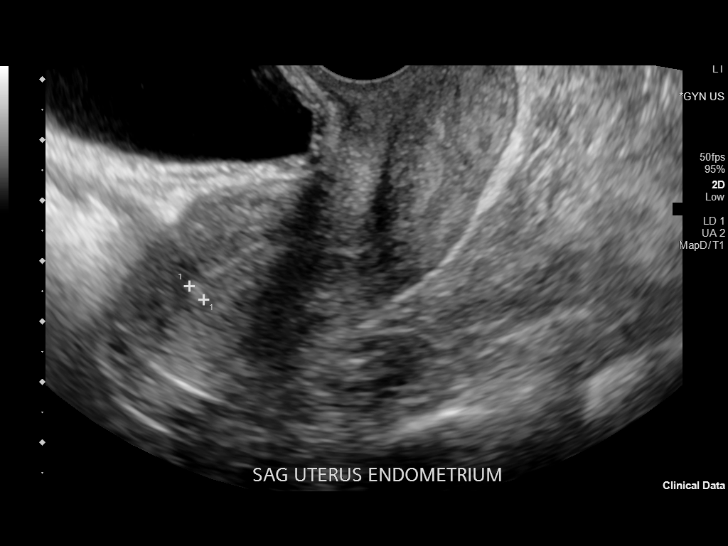
[im 41/65]
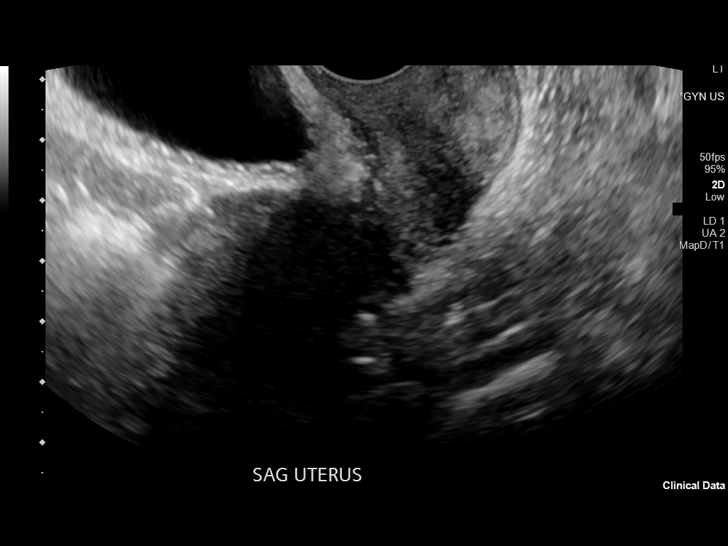
[im 43/65]
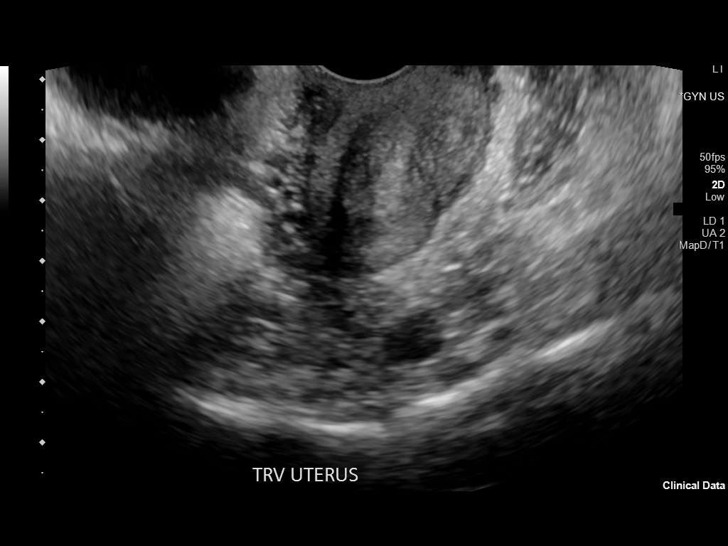
[im 49/65]
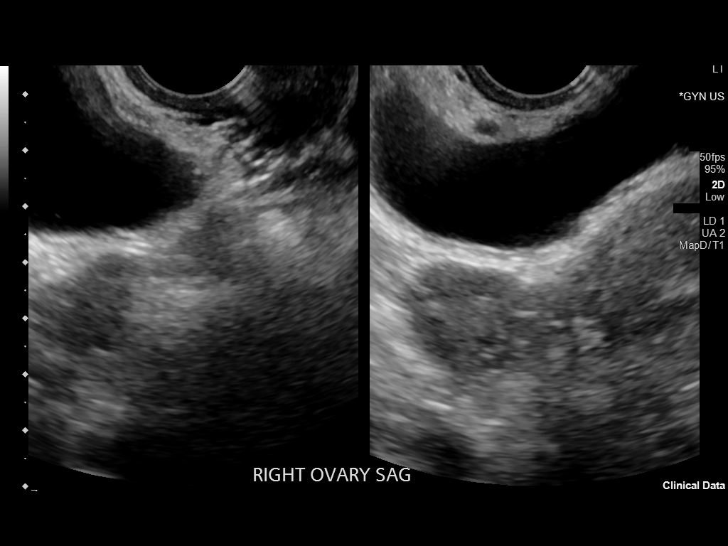
[im 54/65]
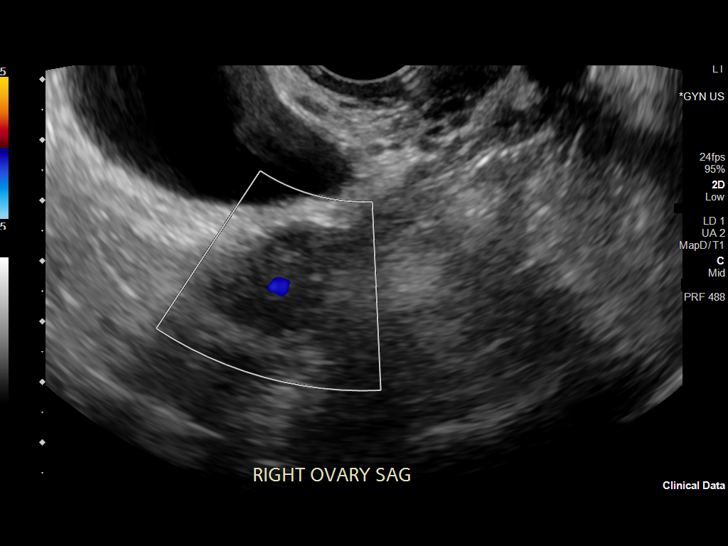
[im 59/65]
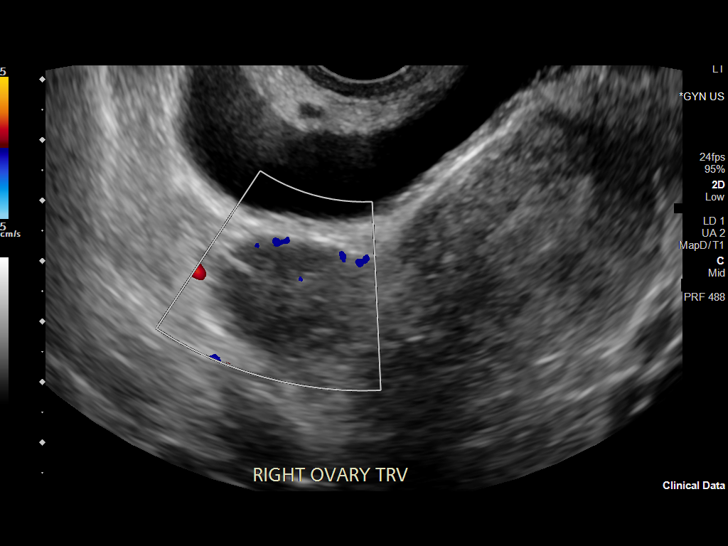
[im 65/65]
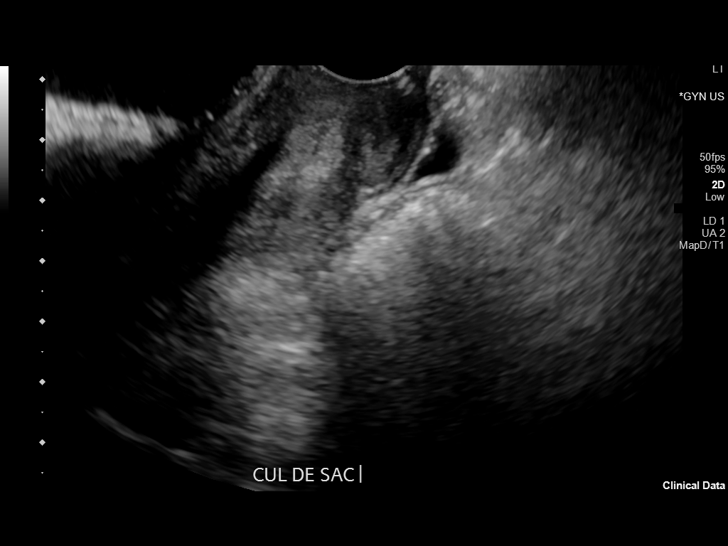

[14 of 25 positions shown; findings below may reference images not displayed]

FINDINGS: Uterus

Measurements: 7.9 cm x 3.3 cm x 4.1 cm = volume: 54.9 mL. No
fibroids or other mass visualized.

Endometrium

Thickness: 3.2 mm.  No focal abnormality visualized.

Right ovary

Measurements: 2.1 cm x 1.9 cm x 2.0 cm = volume: 4.1 mL. Normal
appearance/no adnexal mass.

Left ovary

The left ovary is not visualized.

Other findings

The study is limited secondary to the patient's body habitus and
overlying bowel gas.
IMPRESSION: 1. Limited study with nonvisualization of the left ovary.
2. Otherwise, unremarkable ultrasonographic evaluation of the
pelvis.

## 2022-01-09 ENCOUNTER — Other Ambulatory Visit: Payer: Self-pay

## 2022-01-09 ENCOUNTER — Emergency Department (HOSPITAL_COMMUNITY)
Admission: EM | Admit: 2022-01-09 | Discharge: 2022-01-10 | Disposition: A | Discharge status: Home / Self Care | Payer: Medicaid Other | Attending: Emergency Medicine | Admitting: Emergency Medicine

## 2022-01-09 ENCOUNTER — Emergency Department (HOSPITAL_COMMUNITY): Payer: Medicaid Other

## 2022-01-09 ENCOUNTER — Encounter (HOSPITAL_COMMUNITY): Payer: Self-pay

## 2022-01-09 DIAGNOSIS — R102 Pelvic and perineal pain: Secondary | ICD-10-CM | POA: Insufficient documentation

## 2022-01-09 LAB — WET PREP, GENITAL
Clue Cells Wet Prep HPF POC: NONE SEEN
Sperm: NONE SEEN
Trich, Wet Prep: NONE SEEN
WBC, Wet Prep HPF POC: >=10 — AB (ref <10)
Yeast Wet Prep HPF POC: NONE SEEN

## 2022-01-09 LAB — BASIC METABOLIC PANEL
Anion gap: 7 (ref 5–15)
BUN: 10 mg/dL (ref 6–20)
CO2: 24 mmol/L (ref 22–32)
Calcium: 9.3 mg/dL (ref 8.9–10.3)
Chloride: 110 mmol/L (ref 98–111)
Creatinine, Ser: 0.85 mg/dL (ref 0.44–1.00)
GFR, Estimated: >60 mL/min (ref >60)
Glucose, Bld: 92 mg/dL (ref 70–99)
Potassium: 3.7 mmol/L (ref 3.5–5.1)
Sodium: 141 mmol/L (ref 135–145)

## 2022-01-09 LAB — URINALYSIS, ROUTINE W REFLEX MICROSCOPIC
Bilirubin Urine: NEGATIVE
Glucose, UA: NEGATIVE mg/dL
Hgb urine dipstick: NEGATIVE
Ketones, ur: NEGATIVE mg/dL
Nitrite: NEGATIVE
Protein, ur: NEGATIVE mg/dL
Specific Gravity, Urine: 1.027 (ref 1.005–1.030)
pH: 5 (ref 5.0–8.0)

## 2022-01-09 LAB — CBC
HCT: 40.5 % (ref 36.0–46.0)
Hemoglobin: 12.9 g/dL (ref 12.0–15.0)
MCH: 29.5 pg (ref 26.0–34.0)
MCHC: 31.9 g/dL (ref 30.0–36.0)
MCV: 92.5 fL (ref 80.0–100.0)
Platelets: 349 10*3/uL (ref 150–400)
RBC: 4.38 MIL/uL (ref 3.87–5.11)
RDW: 14 % (ref 11.5–15.5)
WBC: 8.2 10*3/uL (ref 4.0–10.5)
nRBC: 0 % (ref 0.0–0.2)

## 2022-01-09 LAB — I-STAT BETA HCG BLOOD, ED (MC, WL, AP ONLY): I-stat hCG, quantitative: <5 m[IU]/mL (ref <5)

## 2022-01-09 MED ORDER — CEFTRIAXONE SODIUM 1 G IJ SOLR
500.0000 mg | Freq: Once | INTRAMUSCULAR | Status: AC
  Administered 2022-01-09: 500 mg via INTRAMUSCULAR
  Filled 2022-01-09: qty 10

## 2022-01-09 MED ORDER — LIDOCAINE HCL (PF) 1 % IJ SOLN
INTRAMUSCULAR | Status: AC
  Administered 2022-01-09: 1 mL
  Filled 2022-01-09: qty 30

## 2022-01-09 MED ORDER — DOXYCYCLINE HYCLATE 100 MG PO TABS
100.0000 mg | ORAL_TABLET | Freq: Once | ORAL | Status: AC
  Administered 2022-01-09: 100 mg via ORAL
  Filled 2022-01-09: qty 1

## 2022-01-09 MED ORDER — ONDANSETRON 4 MG PO TBDP
4.0000 mg | ORAL_TABLET | Freq: Once | ORAL | Status: AC
  Administered 2022-01-09: 4 mg via ORAL
  Filled 2022-01-09: qty 1

## 2022-01-09 NOTE — ED Provider Notes (Signed)
Patient care assumed at 2300.  Care assumed pending pelvic US  Ultrasound is negative for cyst or mass.  There is presence of simple pelvic free fluid.  On examination she has pelvic tenderness without peritonitis.  Current clinical picture is not consistent with abscess, appendicitis, SBO.  Do not feel that CT abdomen pelvis is necessary at this time with the constellation of her symptoms.  Discussed with patient ultrasound results.  We will treat empirically for pelvic inflammatory disease.  Discussed importance of return precautions if she has any progressive or new concerning symptoms.   Tilden Fossa, MD 01/10/22 [phone]

## 2022-01-09 NOTE — ED Notes (Signed)
Pelvic exam ready at bedside. ?

## 2022-01-09 NOTE — ED Triage Notes (Signed)
Patient said she has had pelvic pain (more to the right) for 3 weeks. Constant cramping pain. She said her last period was heavier than normal.

## 2022-01-09 NOTE — ED Provider Notes (Signed)
[facility]-EMERGENCY DEPT Provider Note   CSN: [id number] Arrival date & time: 01/09/22  2015     History  Chief Complaint  Patient presents with   Pelvic Pain    Bethany Pierce is a 21 y.o. female.  HPI     21yo female with no significant medical history who presents with concern for pelvic pain for 3 weeks.   5/28-6/1 menses, heavier than usual Pain for 3 weeks, come and go, week before period was constant, cramping lower pain, a little bit worse on the right Some constipation, no diarrhea, no nausea vomiting nor fevers Normal appetite No dysuria, feels like pressure when urinating  Discharge started yesterday Sexually active, female partner, usually uses condoms, last 2 has not used them . No known sti  History reviewed. No pertinent past medical history.    Home Medications Prior to Admission medications   Medication Sig Start Date End Date Taking? Authorizing Provider  acetaminophen (TYLENOL) 500 MG tablet Take 500 mg by mouth every 6 (six) hours as needed for moderate pain.   Yes [provider]  valACYclovir (VALTREX) 1000 MG tablet Take 1,000 mg by mouth daily.   Yes [provider]  doxycycline (VIBRA-TABS) 100 MG tablet Take 1 tablet (100 mg total) by mouth 2 (two) times daily. Patient not taking: Reported on 01/09/2022 05/09/21   McDonald, Pedro Earls A, PA-C  doxycycline (VIBRAMYCIN) 100 MG capsule Take 1 capsule (100 mg total) by mouth 2 (two) times daily. Patient not taking: Reported on 01/09/2022 05/09/21   Terrilee Files, MD  metroNIDAZOLE (FLAGYL) 500 MG tablet Take 1 tablet (500 mg total) by mouth 2 (two) times daily. Patient not taking: Reported on 01/09/2022 05/09/21   Terrilee Files, MD      Allergies    Patient has no known allergies.    Review of Systems   Review of Systems  Physical Exam Updated Vital Signs BP (!) 136/117   Pulse 63   Temp 98.4 F (36.9 C) (Oral)   Resp 19   Ht 5\' 6"  (1.676 m)   Wt (!)  183.7 kg   SpO2 98%   BMI 65.37 kg/m  Physical Exam Vitals and nursing note reviewed.  Constitutional:      General: She is not in acute distress.    Appearance: She is well-developed. She is not diaphoretic.  HENT:     Head: Normocephalic and atraumatic.  Eyes:     Conjunctiva/sclera: Conjunctivae normal.  Cardiovascular:     Rate and Rhythm: Normal rate and regular rhythm.     Heart sounds: Normal heart sounds. No murmur heard.   No friction rub. No gallop.  Pulmonary:     Effort: Pulmonary effort is normal. No respiratory distress.     Breath sounds: Normal breath sounds. No wheezing or rales.  Abdominal:     General: There is no distension.     Palpations: Abdomen is soft.     Tenderness: There is no abdominal tenderness. There is no guarding.  Genitourinary:    Cervix: Cervical motion tenderness present. Discharge: scant.    Uterus: Tender.      Adnexa:        Right: Tenderness present.        Left: Tenderness present.   Musculoskeletal:        General: No tenderness.     Cervical back: Normal range of motion.  Skin:    General: Skin is warm and dry.     Findings:  No erythema or rash.  Neurological:     Mental Status: She is alert and oriented to person, place, and time.    ED Results / Procedures / Treatments   Labs (all labs ordered are listed, but only abnormal results are displayed) Labs Reviewed  WET PREP, GENITAL - Abnormal; Notable for the following components:      Result Value   WBC, Wet Prep HPF POC >=10 (*)    All other components within normal limits  URINALYSIS, ROUTINE W REFLEX MICROSCOPIC - Abnormal; Notable for the following components:   APPearance HAZY (*)    Leukocytes,Ua MODERATE (*)    Bacteria, UA RARE (*)    All other components within normal limits  CBC  BASIC METABOLIC PANEL  I-STAT BETA HCG BLOOD, ED ([facility], [facility], [facility] ONLY)  GC/CHLAMYDIA PROBE AMP () NOT AT [facility]    EKG None  Radiology US PELVIC COMPLETE W TRANSVAGINAL  AND TORSION R/O  Result Date: 01/09/2022 CLINICAL DATA:  [id number].  Pelvic pain for 3 weeks. EXAM: TRANSABDOMINAL AND TRANSVAGINAL ULTRASOUND OF PELVIS DOPPLER ULTRASOUND OF OVARIES TECHNIQUE: Both transabdominal and transvaginal ultrasound examinations of the pelvis were performed. Transabdominal technique was performed for global imaging of the pelvis including uterus, ovaries, adnexal regions, and pelvic cul-de-sac. It was necessary to proceed with endovaginal exam following the transabdominal exam to visualize the uterus, endometrium, bilateral ovaries. Color and duplex Doppler ultrasound was utilized to evaluate blood flow to the ovaries. COMPARISON:  CT abdomen pelvis 03/14/2020 FINDINGS: Uterus Measurements: 6.1 x 3.2 x 3.4 cm = volume: 35 mL. No fibroids or other mass visualized. Endometrium Thickness: 4 mm.  No focal abnormality visualized. Right ovary Measurements: 2.6 x 1.9 x 2.4 cm = volume: 6.2 mL. Normal appearance/no adnexal mass. Left ovary Measurements: 4 x 1.7 x 2 cm = volume: 7.4 mL. Normal appearance/no adnexal mass. Pulsed Doppler evaluation of both ovaries demonstrates normal low-resistance arterial and venous waveforms. Other findings At least small volume simple free fluid. IMPRESSION: At least small volume pelvic simple free fluid. Please consider CT with intravenous contrast for further evaluation. Electronically Signed   By: Tish Frederickson M.D.   On: 01/09/2022 23:26    Procedures Procedures    Medications Ordered in ED Medications  cefTRIAXone (ROCEPHIN) injection 500 mg (500 mg Intramuscular Given 01/09/22 2317)  ondansetron (ZOFRAN-ODT) disintegrating tablet 4 mg (4 mg Oral Given 01/09/22 2316)  doxycycline (VIBRA-TABS) tablet 100 mg (100 mg Oral Given 01/09/22 2315)  lidocaine (PF) (XYLOCAINE) 1 % injection (1 mL  Given 01/09/22 2317)    ED Course/ Medical Decision Making/ A&P                           Medical Decision Making Amount and/or Complexity of Data Reviewed Labs:  ordered. Decision-making details documented in ED Course. Radiology: ordered.  Risk Prescription drug management.    21yo female with no significant medical history who presents with concern for pelvic pain for 3 weeks.   DDx includes appendicitis, pancreatitis, cholecystitis, pyelonephritis, nephrolithiasis, diverticulitis, PID, ovarian torsion, ectopic pregnancy, and tuboovarian abscess   Clinically I have low suspicion at this time for appendicitis, diverticulitis, cholecystitis, pancreatitis given history, exam, with 3 weeks of lower pain, no fever, no n/v, no leukocytosis.    Pelvic exam with tenderness to uterus, bilateral adnexa.  WIll obtain pelvic US to evaluate for other pathology.  Will treat empirically for STI, likely PID treatment if no significant findings on  US.  Given rocephin/doxycycline.  US pending/UA resulting at time of transfer of care.         Final Clinical Impression(s) / ED Diagnoses Final diagnoses:  Pelvic pain in female    Rx / DC Orders ED Discharge Orders     None         Alvira MondaySchlossman, Maris Abascal, MD 01/10/22 0040

## 2022-01-10 MED ORDER — DOXYCYCLINE HYCLATE 100 MG PO CAPS: 100.0000 mg | ORAL_CAPSULE | Freq: Two times a day (BID) | ORAL | 0 refills | Status: DC

## 2022-01-10 MED ORDER — ONDANSETRON HCL 4 MG PO TABS: 4.0000 mg | ORAL_TABLET | Freq: Three times a day (TID) | ORAL | 0 refills | Status: DC | PRN

## 2022-01-10 NOTE — ED Notes (Signed)
I provided reinforced discharge education based off of discharge instructions. Pt acknowledged and understood my education. Pt had no further questions/concerns for provider/myself.  °

## 2022-01-11 LAB — GC/CHLAMYDIA PROBE AMP (~~LOC~~) NOT AT ARMC
Chlamydia: POSITIVE — AB
Comment: NEGATIVE
Comment: NORMAL
Neisseria Gonorrhea: NEGATIVE

## 2022-08-06 ENCOUNTER — Encounter: Payer: Self-pay | Admitting: Radiology

## 2022-08-06 ENCOUNTER — Ambulatory Visit: Payer: Medicaid Other | Admitting: Radiology

## 2022-08-06 VITALS — BP 134/88 | Temp 98.2°F | Ht 65.5 in | Wt 395.0 lb

## 2022-08-06 DIAGNOSIS — N72 Inflammatory disease of cervix uteri: Secondary | ICD-10-CM | POA: Diagnosis not present

## 2022-08-06 DIAGNOSIS — N898 Other specified noninflammatory disorders of vagina: Secondary | ICD-10-CM | POA: Diagnosis not present

## 2022-08-06 DIAGNOSIS — R102 Pelvic and perineal pain: Secondary | ICD-10-CM | POA: Diagnosis not present

## 2022-08-06 DIAGNOSIS — R030 Elevated blood-pressure reading, without diagnosis of hypertension: Secondary | ICD-10-CM | POA: Diagnosis not present

## 2022-08-06 MED ORDER — METRONIDAZOLE 500 MG PO TABS: 500.0000 mg | ORAL_TABLET | Freq: Two times a day (BID) | ORAL | 0 refills | Status: DC

## 2022-08-06 MED ORDER — DOXYCYCLINE HYCLATE 100 MG PO CAPS: 100.0000 mg | ORAL_CAPSULE | Freq: Two times a day (BID) | ORAL | 0 refills | Status: DC

## 2022-08-06 NOTE — Progress Notes (Signed)
      Subjective: Bethany Pierce is a 21 y.o. female who complains of pelvic pain intermittently  x 3 month with vaginal odor, tried monistat with some relief. Now with dyspareunia at her cervix x 1 month. Denies any urinary symptoms. Using condoms with long term partner.  Review of Systems  All other systems reviewed and are negative.   No past medical history on file.    Objective:  Today's Vitals   08/06/22 0801  BP: (!) 132/112  Temp: 98.2 F (36.8 C)  TempSrc: Oral  Weight: (!) 395 lb (179.2 kg)  Height: 5' 5.5" (1.664 m)   Body mass index is 64.73 kg/m.   -General: no acute distress -Vulva: without lesions or discharge -Vagina: discharge present, aptima swab and wet prep obtained -Cervix: no lesion or discharge, no CMT -Perineum: no lesions -Uterus: Mobile, non tender -Adnexa: no masses or tenderness  Urine dipstick shows positive for RBC's.  Micro exam: few+ bacteria.    Chaperone offered and declined.  Assessment:/Plan:  1. Pelvic pain - Pregnancy, urine; negative - Urinalysis,Complete w/RFL Culture - SureSwab Advanced Vaginitis Plus,TMA  2. Cervicitis - doxycycline (VIBRAMYCIN) 100 MG capsule; Take 1 capsule (100 mg total) by mouth 2 (two) times daily.  Dispense: 14 capsule; Refill: 0 - SureSwab Advanced Vaginitis Plus,TMA  3. Vaginal odor Presumptively treat for BV based on symptoms - metroNIDAZOLE (FLAGYL) 500 MG tablet; Take 1 tablet (500 mg total) by mouth 2 (two) times daily.  Dispense: 14 tablet; Refill: 0 - SureSwab Advanced Vaginitis Plus,TMA   4. Elevated blood pressure reading without diagnosis of hypertension Follow up with PCP, warning signs reviewed    Will contact patient with results of testing completed today. Avoid intercourse until symptoms are resolved. Safe sex encouraged. Avoid the use of soaps or perfumed products in the peri area. Avoid tub baths and sitting in sweaty or wet clothing for prolonged periods of time.

## 2022-08-07 LAB — URINALYSIS, COMPLETE W/RFL CULTURE
Bilirubin Urine: NEGATIVE
Glucose, UA: NEGATIVE
Hyaline Cast: NONE SEEN /LPF
Ketones, ur: NEGATIVE
Leukocyte Esterase: NEGATIVE
Nitrites, Initial: NEGATIVE
Protein, ur: NEGATIVE
Specific Gravity, Urine: 1.025 (ref 1.001–1.035)
pH: 5.5 (ref 5.0–8.0)

## 2022-08-07 LAB — CULTURE INDICATED

## 2022-08-07 LAB — SURESWAB® ADVANCED VAGINITIS PLUS,TMA
C. trachomatis RNA, TMA: NOT DETECTED
CANDIDA SPECIES: DETECTED — AB
Candida glabrata: NOT DETECTED
N. gonorrhoeae RNA, TMA: NOT DETECTED
SURESWAB(R) ADV BACTERIAL VAGINOSIS(BV),TMA: NEGATIVE
TRICHOMONAS VAGINALIS (TV),TMA: NOT DETECTED

## 2022-08-07 LAB — URINE CULTURE
MICRO NUMBER:: 14370054
SPECIMEN QUALITY:: ADEQUATE

## 2022-08-07 LAB — PREGNANCY, URINE: Preg Test, Ur: NEGATIVE

## 2022-08-11 ENCOUNTER — Other Ambulatory Visit: Payer: Self-pay

## 2022-08-11 MED ORDER — FLUCONAZOLE 150 MG PO TABS: ORAL_TABLET | ORAL | 0 refills | Status: DC

## 2022-08-23 ENCOUNTER — Telehealth: Payer: Medicaid Other | Admitting: Nurse Practitioner

## 2022-08-23 DIAGNOSIS — N3000 Acute cystitis without hematuria: Secondary | ICD-10-CM | POA: Diagnosis not present

## 2022-08-23 MED ORDER — NITROFURANTOIN MONOHYD MACRO 100 MG PO CAPS
100.0000 mg | ORAL_CAPSULE | Freq: Two times a day (BID) | ORAL | 0 refills | Status: AC
Start: 2022-08-23 — End: 2022-08-28

## 2022-08-23 NOTE — Progress Notes (Signed)
Virtual Visit Consent   Bethany Pierce, you are scheduled for a virtual visit with a [facility] provider today. Just as with appointments in the office, your consent must be obtained to participate. Your consent will be active for this visit and any virtual visit you may have with one of our providers in the next 365 days. If you have a MyChart account, a copy of this consent can be sent to you electronically.  As this is a virtual visit, video technology does not allow for your provider to perform a traditional examination. This may limit your provider's ability to fully assess your condition. If your provider identifies any concerns that need to be evaluated in person or the need to arrange testing (such as labs, EKG, etc.), we will make arrangements to do so. Although advances in technology are sophisticated, we cannot ensure that it will always work on either your end or our end. If the connection with a video visit is poor, the visit may have to be switched to a telephone visit. With either a video or telephone visit, we are not always able to ensure that we have a secure connection.  By engaging in this virtual visit, you consent to the provision of healthcare and authorize for your insurance to be billed (if applicable) for the services provided during this visit. Depending on your insurance coverage, you may receive a charge related to this service.  I need to obtain your verbal consent now. Are you willing to proceed with your visit today? Bethany Pierce has provided verbal consent on 08/23/2022 for a virtual visit (video or telephone). Bethany Schneiders, FNP  Date: 08/23/2022 9:34 AM  Virtual Visit via Video Note   I, Bethany Pierce, connected with  Bethany Pierce  ([id number], [birth year]) on 08/23/22 at  9:30 AM EST by a video-enabled telemedicine application and verified that I am speaking with the correct person using two identifiers.  Location: Patient: Virtual Visit Location Patient:  Home Provider: Virtual Visit Location Provider: Home Office   I discussed the limitations of evaluation and management by telemedicine and the availability of in person appointments. The patient expressed understanding and agreed to proceed.    History of Present Illness: Bethany Pierce is a 22 y.o. who identifies as a female who was assigned female at birth, and is being seen today for symptoms consistent with a UTI.   She has been experiencing urinary frequency and urgency for the past 24 hours.  She has pain with urination and after urination   She has had a UTI in the past  Denies any history of complicated UTIs or kidney disease  Denies vaginal discharge   She is sexually active, she had STI testing recently though she has had a new partner since that time   Problems: There are no problems to display for this patient.   Allergies: No Known Allergies Medications:  Current Outpatient Medications:    fluconazole (DIFLUCAN) 150 MG tablet, Take one tablet po every 3 days x 3 doses., Disp: 3 tablet, Rfl: 0   acetaminophen (TYLENOL) 500 MG tablet, Take 500 mg by mouth every 6 (six) hours as needed for moderate pain., Disp: , Rfl:    celecoxib (CELEBREX) 200 MG capsule, Take 1 tablet by mouth daily. (Patient not taking: Reported on 08/06/2022), Disp: , Rfl:    doxycycline (VIBRAMYCIN) 100 MG capsule, Take 1 capsule (100 mg total) by mouth 2 (two) times daily., Disp: 14 capsule, Rfl: 0   metroNIDAZOLE (FLAGYL) 500 MG  tablet, Take 1 tablet (500 mg total) by mouth 2 (two) times daily., Disp: 14 tablet, Rfl: 0   ondansetron (ZOFRAN) 4 MG tablet, Take 1 tablet (4 mg total) by mouth every 8 (eight) hours as needed for nausea or vomiting. (Patient not taking: Reported on 08/06/2022), Disp: 8 tablet, Rfl: 0   valACYclovir (VALTREX) 1000 MG tablet, Take 1,000 mg by mouth daily., Disp: , Rfl:   Observations/Objective: Patient is well-developed, well-nourished in no acute distress.  Resting  comfortably  at home.  Head is normocephalic, atraumatic.  No labored breathing.  Speech is clear and coherent with logical content.  Patient is alert and oriented at baseline.    Assessment and Plan:  1. Acute cystitis without hematuria Push fluids and rest  If symptoms persist recommended follow up testing with PCP as discussed   - nitrofurantoin, macrocrystal-monohydrate, (MACROBID) 100 MG capsule; Take 1 capsule (100 mg total) by mouth 2 (two) times daily for 5 days.  Dispense: 10 capsule; Refill: 0    Follow Up Instructions: I discussed the assessment and treatment plan with the patient. The patient was provided an opportunity to ask questions and all were answered. The patient agreed with the plan and demonstrated an understanding of the instructions.  A copy of instructions were sent to the patient via MyChart unless otherwise noted below.    The patient was advised to call back or seek an in-person evaluation if the symptoms worsen or if the condition fails to improve as anticipated.  Time:  I spent 7 minutes with the patient via telehealth technology discussing the above problems/concerns.    Bethany Schneiders, FNP

## 2022-08-26 ENCOUNTER — Telehealth: Payer: Medicaid Other | Admitting: Family Medicine

## 2022-08-26 DIAGNOSIS — N898 Other specified noninflammatory disorders of vagina: Secondary | ICD-10-CM

## 2022-08-26 DIAGNOSIS — R102 Pelvic and perineal pain: Secondary | ICD-10-CM

## 2022-08-26 NOTE — Patient Instructions (Signed)
  Melburn Hake, thank you for joining Perlie Mayo, NP for today's virtual visit.  While this provider is not your primary care provider (PCP), if your PCP is located in our provider database this encounter information will be shared with them immediately following your visit.   [facility] account gives you access to today's visit and all your visits, tests, and labs performed at [facility] " click here if you don't have a [facility] account or go to [email or web address]  Consent: (Patient) Bethany Pierce provided verbal consent for this virtual visit at the beginning of the encounter.  Current Medications:  Current Outpatient Medications:    fluconazole (DIFLUCAN) 150 MG tablet, Take one tablet po every 3 days x 3 doses., Disp: 3 tablet, Rfl: 0   acetaminophen (TYLENOL) 500 MG tablet, Take 500 mg by mouth every 6 (six) hours as needed for moderate pain., Disp: , Rfl:    celecoxib (CELEBREX) 200 MG capsule, Take 1 tablet by mouth daily. (Patient not taking: Reported on 08/06/2022), Disp: , Rfl:    doxycycline (VIBRAMYCIN) 100 MG capsule, Take 1 capsule (100 mg total) by mouth 2 (two) times daily., Disp: 14 capsule, Rfl: 0   metroNIDAZOLE (FLAGYL) 500 MG tablet, Take 1 tablet (500 mg total) by mouth 2 (two) times daily., Disp: 14 tablet, Rfl: 0   nitrofurantoin, macrocrystal-monohydrate, (MACROBID) 100 MG capsule, Take 1 capsule (100 mg total) by mouth 2 (two) times daily for 5 days., Disp: 10 capsule, Rfl: 0   ondansetron (ZOFRAN) 4 MG tablet, Take 1 tablet (4 mg total) by mouth every 8 (eight) hours as needed for nausea or vomiting. (Patient not taking: Reported on 08/06/2022), Disp: 8 tablet, Rfl: 0   valACYclovir (VALTREX) 1000 MG tablet, Take 1,000 mg by mouth daily., Disp: , Rfl:    Medications ordered in this encounter:  No orders of the defined types were placed in this encounter.    *If you need refills on other medications prior to your next  appointment, please contact your pharmacy*  Follow-Up: Call back or seek an in-person evaluation if the symptoms worsen or if the condition fails to improve as anticipated.  [place] [phone]  Other Instructions   If you have been instructed to have an in-person evaluation today at a local Urgent Care facility, please use the link below. It will take you to a list of all of our available [facility], including address, phone number and hours of operation. Please do not delay care.  [facility]  If you or a family member do not have a primary care provider, use the link below to schedule a visit and establish care. When you choose a [facility] primary care physician or advanced practice provider, you gain a long-term partner in health. Find a Primary Care Provider  Learn more about [facility] in-office and virtual care options: [facility]

## 2022-08-26 NOTE — Progress Notes (Signed)
Virtual Visit Consent   Bethany Pierce, you are scheduled for a virtual visit with a [facility] provider today. Just as with appointments in the office, your consent must be obtained to participate. Your consent will be active for this visit and any virtual visit you may have with one of our providers in the next 365 days. If you have a MyChart account, a copy of this consent can be sent to you electronically.  As this is a virtual visit, video technology does not allow for your provider to perform a traditional examination. This may limit your provider's ability to fully assess your condition. If your provider identifies any concerns that need to be evaluated in person or the need to arrange testing (such as labs, EKG, etc.), we will make arrangements to do so. Although advances in technology are sophisticated, we cannot ensure that it will always work on either your end or our end. If the connection with a video visit is poor, the visit may have to be switched to a telephone visit. With either a video or telephone visit, we are not always able to ensure that we have a secure connection.  By engaging in this virtual visit, you consent to the provision of healthcare and authorize for your insurance to be billed (if applicable) for the services provided during this visit. Depending on your insurance coverage, you may receive a charge related to this service.  I need to obtain your verbal consent now. Are you willing to proceed with your visit today? Bethany Pierce has provided verbal consent on 08/26/2022 for a virtual visit (video or telephone). Perlie Mayo, NP  Date: 08/26/2022 10:43 AM  Virtual Visit via Video Note   I, Perlie Mayo, connected with  Bethany Pierce  ([id number], May 06, 2001) on 08/26/22 at 10:45 AM EST by a video-enabled telemedicine application and verified that I am speaking with the correct person using two identifiers.  Location: Patient: Virtual Visit Location Patient:  Home Provider: Virtual Visit Location Provider: Home Office   I discussed the limitations of evaluation and management by telemedicine and the availability of in person appointments. The patient expressed understanding and agreed to proceed.    History of Present Illness: Devonda Pequignot is a 22 y.o. who identifies as a female who was assigned female at birth, and is being seen today for worsening/ new symptoms- uti symptoms while on treatment- 08/23/22 was started on Macrobid- pain is improving- but now has pain on the left side- and vaginal discharge. She did start with a new partner post her recent STI testing.   Problems: There are no problems to display for this patient.   Allergies: No Known Allergies Medications:  Current Outpatient Medications:    fluconazole (DIFLUCAN) 150 MG tablet, Take one tablet po every 3 days x 3 doses., Disp: 3 tablet, Rfl: 0   acetaminophen (TYLENOL) 500 MG tablet, Take 500 mg by mouth every 6 (six) hours as needed for moderate pain., Disp: , Rfl:    celecoxib (CELEBREX) 200 MG capsule, Take 1 tablet by mouth daily. (Patient not taking: Reported on 08/06/2022), Disp: , Rfl:    doxycycline (VIBRAMYCIN) 100 MG capsule, Take 1 capsule (100 mg total) by mouth 2 (two) times daily., Disp: 14 capsule, Rfl: 0   metroNIDAZOLE (FLAGYL) 500 MG tablet, Take 1 tablet (500 mg total) by mouth 2 (two) times daily., Disp: 14 tablet, Rfl: 0   nitrofurantoin, macrocrystal-monohydrate, (MACROBID) 100 MG capsule, Take 1 capsule (100 mg total) by mouth  2 (two) times daily for 5 days., Disp: 10 capsule, Rfl: 0   ondansetron (ZOFRAN) 4 MG tablet, Take 1 tablet (4 mg total) by mouth every 8 (eight) hours as needed for nausea or vomiting. (Patient not taking: Reported on 08/06/2022), Disp: 8 tablet, Rfl: 0   valACYclovir (VALTREX) 1000 MG tablet, Take 1,000 mg by mouth daily., Disp: , Rfl:   Observations/Objective: Patient is well-developed, well-nourished in no acute distress.   Resting comfortably  at home.  Head is normocephalic, atraumatic.  No labored breathing.  Speech is clear and coherent with logical content.  Patient is alert and oriented at baseline.    Assessment and Plan:  1. Vaginal discharge   2. Pelvic pain   -visit stopped- she is advised to follow up in person for sample needs -might need ABX change vs new treatment plan based off results of sample.  Patient acknowledged agreement and understanding of the plan.       Follow Up Instructions: I discussed the assessment and treatment plan with the patient. The patient was provided an opportunity to ask questions and all were answered. The patient agreed with the plan and demonstrated an understanding of the instructions.  A copy of instructions were sent to the patient via MyChart unless otherwise noted below.     The patient was advised to call back or seek an in-person evaluation if the symptoms worsen or if the condition fails to improve as anticipated.  Time:  I spent 3 minutes with the patient via telehealth technology discussing the above problems/concerns.    Perlie Mayo, NP

## 2022-09-25 ENCOUNTER — Telehealth: Payer: Medicaid Other | Admitting: Family Medicine

## 2022-09-25 NOTE — Progress Notes (Signed)
Pt a no show for visit after multiple attempts. I used phone number in my chart and entered it myself and tried to call no response. DWB

## 2022-10-14 ENCOUNTER — Ambulatory Visit: Payer: Medicaid Other | Admitting: Radiology

## 2022-10-14 VITALS — BP 128/84

## 2022-10-14 DIAGNOSIS — N760 Acute vaginitis: Secondary | ICD-10-CM

## 2022-10-14 DIAGNOSIS — N898 Other specified noninflammatory disorders of vagina: Secondary | ICD-10-CM

## 2022-10-14 DIAGNOSIS — Z113 Encounter for screening for infections with a predominantly sexual mode of transmission: Secondary | ICD-10-CM

## 2022-10-14 LAB — WET PREP FOR TRICH, YEAST, CLUE

## 2022-10-14 MED ORDER — METRONIDAZOLE 500 MG PO TABS: 500.0000 mg | ORAL_TABLET | Freq: Two times a day (BID) | ORAL | 0 refills | Status: DC

## 2022-10-14 NOTE — Progress Notes (Signed)
      Subjective: Bethany Pierce is a 23 y.o. female who complains of aginal itching, yellow/brown/gray vaginal discharge, slight fishy odor. Symptoms began 2 days ago, after finishing period. Her boyfriend had a UTI 2 weeks ago, was treated with 10 days of an antibiotic.   Review of Systems  All other systems reviewed and are negative.   No past medical history on file.    Objective:  Today's Vitals   10/14/22 1459  BP: 128/84   There is no height or weight on file to calculate BMI.   -General: no acute distress -Vulva: without lesions or discharge -Vagina: discharge present, aptima swab and wet prep obtained -Cervix: no lesion or discharge, no CMT -Perineum: no lesions -Uterus: Mobile, non tender -Adnexa: no masses or tenderness   Microscopic wet-mount exam shows clue cells.   Chaperone offered and declined.  Assessment:/Plan:  1. Vaginal odor - WET PREP FOR TRICH, YEAST, CLUE  2. Screening for STDs (sexually transmitted diseases) - SURESWAB CT/NG/T. vaginalis  3. BV (bacterial vaginosis) - metroNIDAZOLE (FLAGYL) 500 MG tablet; Take 1 tablet (500 mg total) by mouth 2 (two) times daily.  Dispense: 14 tablet; Refill: 0   Will contact patient with results of testing completed today. Avoid intercourse until symptoms are resolved. Safe sex encouraged. Avoid the use of soaps or perfumed products in the peri area. Avoid tub baths and sitting in sweaty or wet clothing for prolonged periods of time.

## 2022-10-15 LAB — SURESWAB CT/NG/T. VAGINALIS
C. trachomatis RNA, TMA: NOT DETECTED
N. gonorrhoeae RNA, TMA: NOT DETECTED
Trichomonas vaginalis RNA: NOT DETECTED

## 2022-10-20 ENCOUNTER — Telehealth: Payer: Medicaid Other | Admitting: Physician Assistant

## 2022-10-20 DIAGNOSIS — T3695XA Adverse effect of unspecified systemic antibiotic, initial encounter: Secondary | ICD-10-CM

## 2022-10-20 DIAGNOSIS — A6004 Herpesviral vulvovaginitis: Secondary | ICD-10-CM | POA: Diagnosis not present

## 2022-10-20 DIAGNOSIS — B379 Candidiasis, unspecified: Secondary | ICD-10-CM | POA: Diagnosis not present

## 2022-10-20 MED ORDER — VALACYCLOVIR HCL 1 G PO TABS: 1000.0000 mg | ORAL_TABLET | Freq: Two times a day (BID) | ORAL | 0 refills | Status: AC

## 2022-10-20 MED ORDER — FLUCONAZOLE 150 MG PO TABS: 150.0000 mg | ORAL_TABLET | ORAL | 0 refills | Status: DC | PRN

## 2022-10-20 NOTE — Progress Notes (Signed)
Virtual Visit Consent   Bethany Pierce, you are scheduled for a virtual visit with a [facility] provider today. Just as with appointments in the office, your consent must be obtained to participate. Your consent will be active for this visit and any virtual visit you may have with one of our providers in the next 365 days. If you have a MyChart account, a copy of this consent can be sent to you electronically.  As this is a virtual visit, video technology does not allow for your provider to perform a traditional examination. This may limit your provider's ability to fully assess your condition. If your provider identifies any concerns that need to be evaluated in person or the need to arrange testing (such as labs, EKG, etc.), we will make arrangements to do so. Although advances in technology are sophisticated, we cannot ensure that it will always work on either your end or our end. If the connection with a video visit is poor, the visit may have to be switched to a telephone visit. With either a video or telephone visit, we are not always able to ensure that we have a secure connection.  By engaging in this virtual visit, you consent to the provision of healthcare and authorize for your insurance to be billed (if applicable) for the services provided during this visit. Depending on your insurance coverage, you may receive a charge related to this service.  I need to obtain your verbal consent now. Are you willing to proceed with your visit today? Bethany Pierce has provided verbal consent on 10/20/2022 for a virtual visit (video or telephone). Mar Daring, PA-C  Date: 10/20/2022 9:00 AM  Virtual Visit via Video Note   I, Mar Daring, connected with  Bethany Pierce  ([id number], August 13, 2000) on 10/20/22 at  9:00 AM EDT by a video-enabled telemedicine application and verified that I am speaking with the correct person using two identifiers.  Location: Patient: Virtual Visit Location  Patient: Home Provider: Virtual Visit Location Provider: Home Office   I discussed the limitations of evaluation and management by telemedicine and the availability of in person appointments. The patient expressed understanding and agreed to proceed.    History of Present Illness: Bethany Pierce is a 22 y.o. who identifies as a female who was assigned female at birth, and is being seen today for vaginal discharge.  HPI: Vaginal Discharge The patient's primary symptoms include genital itching and vaginal discharge. The patient's pertinent negatives include no genital lesions, genital odor, missed menses or pelvic pain. This is a new problem. Episode onset: Seen on 10/13/22, treated for BV. The problem occurs constantly. The problem has been gradually worsening. The patient is experiencing no pain. She is not pregnant. Pertinent negatives include no abdominal pain, back pain, chills, constipation, diarrhea, fever, flank pain, frequency, headaches, nausea, rash or sore throat. The vaginal discharge was white and thick. There has been no bleeding. Nothing aggravates the symptoms. She has tried antibiotics for the symptoms. The treatment provided no relief.     Problems: There are no problems to display for this patient.   Allergies: No Known Allergies Medications:  Current Outpatient Medications:    fluconazole (DIFLUCAN) 150 MG tablet, Take 1 tablet (150 mg total) by mouth every 3 (three) days as needed., Disp: 2 tablet, Rfl: 0   acetaminophen (TYLENOL) 500 MG tablet, Take 500 mg by mouth every 6 (six) hours as needed for moderate pain., Disp: , Rfl:    metroNIDAZOLE (FLAGYL) 500 MG  tablet, Take 1 tablet (500 mg total) by mouth 2 (two) times daily., Disp: 14 tablet, Rfl: 0   phentermine 30 MG capsule, Take 30 mg by mouth daily., Disp: , Rfl:    valACYclovir (VALTREX) 1000 MG tablet, Take 1 tablet (1,000 mg total) by mouth 2 (two) times daily for 6 days., Disp: 12 tablet, Rfl:  0   Observations/Objective: Patient is well-developed, well-nourished in no acute distress.  Resting comfortably at home.  Head is normocephalic, atraumatic.  No labored breathing.  Speech is clear and coherent with logical content.  Patient is alert and oriented at baseline.    Assessment and Plan: 1. Antibiotic-induced yeast infection - fluconazole (DIFLUCAN) 150 MG tablet; Take 1 tablet (150 mg total) by mouth every 3 (three) days as needed.  Dispense: 2 tablet; Refill: 0  2. Herpes simplex vulvovaginitis - valACYclovir (VALTREX) 1000 MG tablet; Take 1 tablet (1,000 mg total) by mouth 2 (two) times daily for 6 days.  Dispense: 12 tablet; Refill: 0  - Symptoms consistent with yeast infection, most likely secondary to recent antibiotic for BV - Fluconazole prescribed - Also having genital herpes outbreak; Valtrex prescribed - Limit bubble baths, scented lotions/soaps/detergents - Limit tight fitting clothing - Seek on person evaluation if not improving or if symptoms worsen   Follow Up Instructions: I discussed the assessment and treatment plan with the patient. The patient was provided an opportunity to ask questions and all were answered. The patient agreed with the plan and demonstrated an understanding of the instructions.  A copy of instructions were sent to the patient via MyChart unless otherwise noted below.    The patient was advised to call back or seek an in-person evaluation if the symptoms worsen or if the condition fails to improve as anticipated.  Time:  I spent 8 minutes with the patient via telehealth technology discussing the above problems/concerns.    Mar Daring, PA-C

## 2022-10-20 NOTE — Patient Instructions (Signed)
Melburn Hake, thank you for joining Mar Daring, PA-C for today's virtual visit.  While this provider is not your primary care provider (PCP), if your PCP is located in our provider database this encounter information will be shared with them immediately following your visit.   [facility] account gives you access to today's visit and all your visits, tests, and labs performed at [facility] " click here if you don't have a [facility] account or go to [email or web address]  Consent: (Patient) Bethany Pierce provided verbal consent for this virtual visit at the beginning of the encounter.  Current Medications:  Current Outpatient Medications:    fluconazole (DIFLUCAN) 150 MG tablet, Take 1 tablet (150 mg total) by mouth every 3 (three) days as needed., Disp: 2 tablet, Rfl: 0   acetaminophen (TYLENOL) 500 MG tablet, Take 500 mg by mouth every 6 (six) hours as needed for moderate pain., Disp: , Rfl:    metroNIDAZOLE (FLAGYL) 500 MG tablet, Take 1 tablet (500 mg total) by mouth 2 (two) times daily., Disp: 14 tablet, Rfl: 0   phentermine 30 MG capsule, Take 30 mg by mouth daily., Disp: , Rfl:    valACYclovir (VALTREX) 1000 MG tablet, Take 1 tablet (1,000 mg total) by mouth 2 (two) times daily for 6 days., Disp: 12 tablet, Rfl: 0   Medications ordered in this encounter:  Meds ordered this encounter  Medications   fluconazole (DIFLUCAN) 150 MG tablet    Sig: Take 1 tablet (150 mg total) by mouth every 3 (three) days as needed.    Dispense:  2 tablet    Refill:  0    Order Specific Question:   Supervising Provider    Answer:   Chase Picket [id number]   valACYclovir (VALTREX) 1000 MG tablet    Sig: Take 1 tablet (1,000 mg total) by mouth 2 (two) times daily for 6 days.    Dispense:  12 tablet    Refill:  0    Order Specific Question:   Supervising Provider    Answer:   Chase Picket [id number]     *If you need refills on other medications  prior to your next appointment, please contact your pharmacy*  Follow-Up: Call back or seek an in-person evaluation if the symptoms worsen or if the condition fails to improve as anticipated.  [facility] [phone]  Other Instructions  Vaginal Yeast Infection, Adult  Vaginal yeast infection is a condition that causes vaginal discharge as well as soreness, swelling, and redness (inflammation) of the vagina. This is a common condition. Some women get this infection frequently. What are the causes? This condition is caused by a change in the normal balance of the yeast (Candida) and normal bacteria that live in the vagina. This change causes an overgrowth of yeast, which causes the inflammation. What increases the risk? The condition is more likely to develop in women who: Take antibiotic medicines. Have diabetes. Take birth control pills. Are pregnant. Douche often. Have a weak body defense system (immune system). Have been taking steroid medicines for a long time. Frequently wear tight clothing. What are the signs or symptoms? Symptoms of this condition include: White, thick, creamy vaginal discharge. Swelling, itching, redness, and irritation of the vagina. The lips of the vagina (labia) may be affected as well. Pain or a burning feeling while urinating. Pain during sex. How is this diagnosed? This condition is diagnosed based on: Your medical history. A physical exam. A  pelvic exam. Your health care provider will examine a sample of your vaginal discharge under a microscope. Your health care provider may send this sample for testing to confirm the diagnosis. How is this treated? This condition is treated with medicine. Medicines may be over-the-counter or prescription. You may be told to use one or more of the following: Medicine that is taken by mouth (orally). Medicine that is applied as a cream (topically). Medicine that is inserted directly into the  vagina (suppository). Follow these instructions at home: Take or apply over-the-counter and prescription medicines only as told by your health care provider. Do not use tampons until your health care provider approves. Do not have sex until your infection has cleared. Sex can prolong or worsen your symptoms of infection. Ask your health care provider when it is safe to resume sexual activity. Keep all follow-up visits. This is important. How is this prevented?  Do not wear tight clothes, such as pantyhose or tight pants. Wear breathable cotton underwear. Do not use douches, perfumed soap, creams, or powders. Wipe from front to back after using the toilet. If you have diabetes, keep your blood sugar levels under control. Ask your health care provider for other ways to prevent yeast infections. Contact a health care provider if: You have a fever. Your symptoms go away and then return. Your symptoms do not get better with treatment. Your symptoms get worse. You have new symptoms. You develop blisters in or around your vagina. You have blood coming from your vagina and it is not your menstrual period. You develop pain in your abdomen. Summary Vaginal yeast infection is a condition that causes discharge as well as soreness, swelling, and redness (inflammation) of the vagina. This condition is treated with medicine. Medicines may be over-the-counter or prescription. Take or apply over-the-counter and prescription medicines only as told by your health care provider. Do not douche. Resume sexual activity or use of tampons as instructed by your health care provider. Contact a health care provider if your symptoms do not get better with treatment or your symptoms go away and then return. This information is not intended to replace advice given to you by your health care provider. Make sure you discuss any questions you have with your health care provider. Document Revised: 10/13/2020 Document  Reviewed: 10/13/2020 Elsevier Patient Education  [place].    If you have been instructed to have an in-person evaluation today at a local Urgent Care facility, please use the link below. It will take you to a list of all of our available [facility], including address, phone number and hours of operation. Please do not delay care.  [facility]  If you or a family member do not have a primary care provider, use the link below to schedule a visit and establish care. When you choose a [facility] primary care physician or advanced practice provider, you gain a long-term partner in health. Find a Primary Care Provider  Learn more about 's in-office and virtual care options: [facility]

## 2022-10-28 ENCOUNTER — Telehealth: Payer: Medicaid Other

## 2022-11-22 ENCOUNTER — Telehealth: Payer: Medicaid Other | Admitting: Physician Assistant

## 2022-11-22 DIAGNOSIS — R3989 Other symptoms and signs involving the genitourinary system: Secondary | ICD-10-CM | POA: Diagnosis not present

## 2022-11-22 MED ORDER — CEPHALEXIN 500 MG PO CAPS: 500.0000 mg | ORAL_CAPSULE | Freq: Two times a day (BID) | ORAL | 0 refills | Status: AC

## 2022-11-22 NOTE — Patient Instructions (Addendum)
Bethany Pierce, thank you for joining Piedad Climes, PA-C for today's virtual visit.  While this provider is not your primary care provider (PCP), if your PCP is located in our provider database this encounter information will be shared with them immediately following your visit.   A [facility] MyChart account gives you access to today's visit and all your visits, tests, and labs performed at [facility] " click here if you don't have a [facility] MyChart account or go to [email or web address]  Consent: (Patient) Bethany Pierce provided verbal consent for this virtual visit at the beginning of the encounter.  Current Medications:  Current Outpatient Medications:    acetaminophen (TYLENOL) 500 MG tablet, Take 500 mg by mouth every 6 (six) hours as needed for moderate pain., Disp: , Rfl:    Medications ordered in this encounter:  No orders of the defined types were placed in this encounter.    *If you need refills on other medications prior to your next appointment, please contact your pharmacy*  Follow-Up: Call back or seek an in-person evaluation if the symptoms worsen or if the condition fails to improve as anticipated.  [facility] [phone]  Other Instructions Your symptoms are consistent with a bladder infection, also called acute cystitis. Please take your antibiotic (Keflex) as directed until all pills are gone.  Stay very well hydrated.  Consider a daily probiotic (Align, Culturelle, or Activia) to help prevent stomach upset caused by the antibiotic.  Taking a probiotic daily may also help prevent recurrent UTIs.  Also consider taking AZO (Phenazopyridine) tablets to help decrease pain with urination.    Urinary Tract Infection A urinary tract infection (UTI) can occur any place along the urinary tract. The tract includes the kidneys, ureters, bladder, and urethra. A type of germ called bacteria often causes a UTI. UTIs are often helped  with antibiotic medicine.  HOME CARE  If given, take antibiotics as told by your doctor. Finish them even if you start to feel better. Drink enough fluids to keep your pee (urine) clear or pale yellow. Avoid tea, drinks with caffeine, and bubbly (carbonated) drinks. Pee often. Avoid holding your pee in for a long time. Pee before and after having sex (intercourse). Wipe from front to back after you poop (bowel movement) if you are a woman. Use each tissue only once. GET HELP RIGHT AWAY IF:  You have back pain. You have lower belly (abdominal) pain. You have chills. You feel sick to your stomach (nauseous). You throw up (vomit). Your burning or discomfort with peeing does not go away. You have a fever. Your symptoms are not better in 3 days. MAKE SURE YOU:  Understand these instructions. Will watch your condition. Will get help right away if you are not doing well or get worse. Document Released: 01/12/2008 Document Revised: 04/19/2012 Document Reviewed: 02/24/2012 [facility] Patient Information 2015 [place], [place]. This information is not intended to replace advice given to you by your health care provider. Make sure you discuss any questions you have with your health care provider.    If you have been instructed to have an in-person evaluation today at a local Urgent Care facility, please use the link below. It will take you to a list of all of our available [facility], including address, phone number and hours of operation. Please do not delay care.  [facility]  If you or a family member do not have a primary care provider, use  the link below to schedule a visit and establish care. When you choose a [facility] primary care physician or advanced practice provider, you gain a long-term partner in health. Find a Primary Care Provider  Learn more about [facility]'s in-office and virtual care options: [facility]

## 2022-11-22 NOTE — Progress Notes (Signed)
Virtual Visit Consent   Bethany Pierce, you are scheduled for a virtual visit with a [facility] provider today. Just as with appointments in the office, your consent must be obtained to participate. Your consent will be active for this visit and any virtual visit you may have with one of our providers in the next 365 days. If you have a MyChart account, a copy of this consent can be sent to you electronically.  As this is a virtual visit, video technology does not allow for your provider to perform a traditional examination. This may limit your provider's ability to fully assess your condition. If your provider identifies any concerns that need to be evaluated in person or the need to arrange testing (such as labs, EKG, etc.), we will make arrangements to do so. Although advances in technology are sophisticated, we cannot ensure that it will always work on either your end or our end. If the connection with a video visit is poor, the visit may have to be switched to a telephone visit. With either a video or telephone visit, we are not always able to ensure that we have a secure connection.  By engaging in this virtual visit, you consent to the provision of healthcare and authorize for your insurance to be billed (if applicable) for the services provided during this visit. Depending on your insurance coverage, you may receive a charge related to this service.  I need to obtain your verbal consent now. Are you willing to proceed with your visit today? Bethany Pierce has provided verbal consent on 11/22/2022 for a virtual visit (video or telephone). Piedad Climes, [place]  Date: 11/22/2022 7:57 AM  Virtual Visit via Video Note   I, Piedad Climes, connected with  Bethany Pierce  ([id number], 2001/05/13) on 11/22/22 at  8:00 AM EDT by a video-enabled telemedicine application and verified that I am speaking with the correct person using two identifiers.  Location: Patient: Virtual Visit Location  Patient: Home Provider: Virtual Visit Location Provider: Home Office   I discussed the limitations of evaluation and management by telemedicine and the availability of in person appointments. The patient expressed understanding and agreed to proceed.    History of Present Illness: Bethany Pierce is a 22 y.o. who identifies as a female who was assigned female at birth, and is being seen today for possible UTI. Endorses urinary urgency, frequency and dysuria. Some urinary hesitancy. Denies fever, chills, nausea or vomiting. Dens hematuria. Denies vaginal symptoms or concern for STI. LMP ended 11/08/2022.   HPI: HPI  Problems: There are no problems to display for this patient.   Allergies: No Known Allergies Medications:  Current Outpatient Medications:    cephALEXin (KEFLEX) 500 MG capsule, Take 1 capsule (500 mg total) by mouth 2 (two) times daily for 7 days., Disp: 14 capsule, Rfl: 0   acetaminophen (TYLENOL) 500 MG tablet, Take 500 mg by mouth every 6 (six) hours as needed for moderate pain., Disp: , Rfl:   Observations/Objective: Patient is well-developed, well-nourished in no acute distress.  Resting comfortably at home.  Head is normocephalic, atraumatic.  No labored breathing. Speech is clear and coherent with logical content.  Patient is alert and oriented at baseline.   Assessment and Plan: 1. Suspected UTI - cephALEXin (KEFLEX) 500 MG capsule; Take 1 capsule (500 mg total) by mouth 2 (two) times daily for 7 days.  Dispense: 14 capsule; Refill: 0  Classic UTI symptoms with absence of alarm signs or symptoms. Prior history  of UTI. Will treat empirically with Keflex for suspected uncomplicated cystitis. Supportive measures and OTC medications reviewed. Strict in-person evaluation precautions discussed.    Follow Up Instructions: I discussed the assessment and treatment plan with the patient. The patient was provided an opportunity to ask questions and all were answered. The  patient agreed with the plan and demonstrated an understanding of the instructions.  A copy of instructions were sent to the patient via MyChart unless otherwise noted below.   The patient was advised to call back or seek an in-person evaluation if the symptoms worsen or if the condition fails to improve as anticipated.  Time:  I spent 10 minutes with the patient via telehealth technology discussing the above problems/concerns.    Piedad Climes, PA-C

## 2022-12-19 ENCOUNTER — Telehealth: Payer: Medicaid Other | Admitting: Family

## 2022-12-19 DIAGNOSIS — B9689 Other specified bacterial agents as the cause of diseases classified elsewhere: Secondary | ICD-10-CM | POA: Diagnosis not present

## 2022-12-19 DIAGNOSIS — N760 Acute vaginitis: Secondary | ICD-10-CM | POA: Diagnosis not present

## 2022-12-19 MED ORDER — METRONIDAZOLE 500 MG PO TABS: 500.0000 mg | ORAL_TABLET | Freq: Two times a day (BID) | ORAL | 0 refills | Status: DC

## 2022-12-19 NOTE — Progress Notes (Signed)
Virtual Visit Consent   Bethany Pierce, you are scheduled for a virtual visit with a [facility] provider today. Just as with appointments in the office, your consent must be obtained to participate. Your consent will be active for this visit and any virtual visit you may have with one of our providers in the next 365 days. If you have a MyChart account, a copy of this consent can be sent to you electronically.  As this is a virtual visit, video technology does not allow for your provider to perform a traditional examination. This may limit your provider's ability to fully assess your condition. If your provider identifies any concerns that need to be evaluated in person or the need to arrange testing (such as labs, EKG, etc.), we will make arrangements to do so. Although advances in technology are sophisticated, we cannot ensure that it will always work on either your end or our end. If the connection with a video visit is poor, the visit may have to be switched to a telephone visit. With either a video or telephone visit, we are not always able to ensure that we have a secure connection.  By engaging in this virtual visit, you consent to the provision of healthcare and authorize for your insurance to be billed (if applicable) for the services provided during this visit. Depending on your insurance coverage, you may receive a charge related to this service.  I need to obtain your verbal consent now. Are you willing to proceed with your visit today? Bethany Pierce has provided verbal consent on 12/19/2022 for a virtual visit (video or telephone). Bethany Rodney, Bethany Pierce  Date: 12/19/2022 2:14 PM  Virtual Visit via Video Note   I, Bethany Pierce, connected with  Bethany Pierce  ([id number], [birth year]) on 12/19/22 at  2:15 PM EDT by a video-enabled telemedicine application and verified that I am speaking with the correct person using two identifiers.  Location: Patient: Virtual Visit Location Patient: Other:  car Provider: Virtual Visit Location Provider: Home Office   I discussed the limitations of evaluation and management by telemedicine and the availability of in person appointments. The patient expressed understanding and agreed to proceed.    History of Present Illness: Bethany Pierce is a 22 y.o. who identifies as a female who was assigned female at birth, and is being seen today for vaginal discharge over the last 5 days.  HPI: Vaginal Discharge The patient's primary symptoms include genital itching, a genital odor and vaginal discharge. The current episode started in the past 7 days. The pain is mild. The vaginal discharge was grey.    Problems: There are no problems to display for this patient.   Allergies: No Known Allergies Medications:  Current Outpatient Medications:    metroNIDAZOLE (FLAGYL) 500 MG tablet, Take 1 tablet (500 mg total) by mouth 2 (two) times daily., Disp: 14 tablet, Rfl: 0   acetaminophen (TYLENOL) 500 MG tablet, Take 500 mg by mouth every 6 (six) hours as needed for moderate pain., Disp: , Rfl:   Observations/Objective: Patient is well-developed, well-nourished in no acute distress.  Resting comfortably   Head is normocephalic, atraumatic.  No labored breathing.  Speech is clear and coherent with logical content.  Patient is alert and oriented at baseline.    Assessment and Plan: 1. BV (bacterial vaginosis) - metroNIDAZOLE (FLAGYL) 500 MG tablet; Take 1 tablet (500 mg total) by mouth 2 (two) times daily.  Dispense: 14 tablet; Refill: 0  Start Flagyl  Keep clean and  dry Follow up if symptoms worsen or do not improve   Follow Up Instructions: I discussed the assessment and treatment plan with the patient. The patient was provided an opportunity to ask questions and all were answered. The patient agreed with the plan and demonstrated an understanding of the instructions.  A copy of instructions were sent to the patient via MyChart unless otherwise noted  below.     The patient was advised to call back or seek an in-person evaluation if the symptoms worsen or if the condition fails to improve as anticipated.  Time:  I spent 7 minutes with the patient via telehealth technology discussing the above problems/concerns.    Bethany Rodney, Bethany Pierce

## 2023-01-17 ENCOUNTER — Telehealth: Payer: Medicaid Other

## 2023-01-17 ENCOUNTER — Telehealth: Payer: Medicaid Other | Admitting: Family Medicine

## 2023-01-17 DIAGNOSIS — A6004 Herpesviral vulvovaginitis: Secondary | ICD-10-CM | POA: Diagnosis not present

## 2023-01-17 MED ORDER — VALACYCLOVIR HCL 1 G PO TABS: 1000.0000 mg | ORAL_TABLET | Freq: Two times a day (BID) | ORAL | 0 refills | Status: AC

## 2023-01-17 NOTE — Progress Notes (Signed)
Virtual Visit Consent   Bethany Pierce, you are scheduled for a virtual visit with a [facility] provider today. Just as with appointments in the office, your consent must be obtained to participate. Your consent will be active for this visit and any virtual visit you may have with one of our providers in the next 365 days. If you have a MyChart account, a copy of this consent can be sent to you electronically.  As this is a virtual visit, video technology does not allow for your provider to perform a traditional examination. This may limit your provider's ability to fully assess your condition. If your provider identifies any concerns that need to be evaluated in person or the need to arrange testing (such as labs, EKG, etc.), we will make arrangements to do so. Although advances in technology are sophisticated, we cannot ensure that it will always work on either your end or our end. If the connection with a video visit is poor, the visit may have to be switched to a telephone visit. With either a video or telephone visit, we are not always able to ensure that we have a secure connection.  By engaging in this virtual visit, you consent to the provision of healthcare and authorize for your insurance to be billed (if applicable) for the services provided during this visit. Depending on your insurance coverage, you may receive a charge related to this service.  I need to obtain your verbal consent now. Are you willing to proceed with your visit today? Bethany Pierce has provided verbal consent on 01/17/2023 for a virtual visit (video or telephone). [name], NP  Date: 01/17/2023 11:00 AM  Virtual Visit via Video Note   I, [name], connected with  Bethany Pierce  ([id number], 2001/05/06) on 01/17/23 at 11:00 AM EDT by a video-enabled telemedicine application and verified that I am speaking with the correct person using two identifiers.  Location: Patient: Virtual Visit Location Patient:  Home Provider: Virtual Visit Location Provider: Home Office   I discussed the limitations of evaluation and management by telemedicine and the availability of in person appointments. The patient expressed understanding and agreed to proceed.    History of Present Illness: Bethany Pierce is a 22 y.o. 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 Does have some vaginal discharge and odor- but recent 12/19/22 for BV  Modifying factors are nothing Denies chest pain, shortness of breath, fevers, chills, no UTI symptoms   Problems: There are no problems to display for this patient.   Allergies: No Known Allergies Medications:  Current Outpatient Medications:    acetaminophen (TYLENOL) 500 MG tablet, Take 500 mg by mouth every 6 (six) hours as needed for moderate pain., Disp: , Rfl:    metroNIDAZOLE (FLAGYL) 500 MG tablet, Take 1 tablet (500 mg total) by mouth 2 (two) times daily., Disp: 14 tablet, Rfl: 0  Observations/Objective: Patient is well-developed, well-nourished in no acute distress.  Resting comfortably  at home.  Head is normocephalic, atraumatic.  No labored breathing.  Speech is clear and coherent with logical content.  Patient is alert and oriented at baseline.    Assessment and Plan:  1. Herpes simplex vulvovaginitis  - valACYclovir (VALTREX) 1000 MG tablet; Take 1 tablet (1,000 mg total)  by mouth 2 (two) times daily for 6 days.  Dispense: 12 tablet; Refill: 0  Educated on HSV 2 and care  Complete medication in full Follow up in person for BV testing- since you just recently have been treated. Follow up if not improved with HSV outbreak   Reviewed side effects, risks and benefits of medication.    Patient acknowledged agreement and understanding of the plan.    Past Medical, Surgical, Social History, Allergies, and Medications have been Reviewed.     Follow Up Instructions: I discussed the assessment and treatment plan with the patient. The patient was provided an opportunity to ask questions and all were answered. The patient agreed with the plan and demonstrated an understanding of the instructions.  A copy of instructions were sent to the patient via MyChart unless otherwise noted below.    The patient was advised to call back or seek an in-person evaluation if the symptoms worsen or if the condition fails to improve as anticipated.  Time:  I spent 10 minutes with the patient via telehealth technology discussing the above problems/concerns.    [name], NP

## 2023-01-17 NOTE — Patient Instructions (Signed)
Bethany Pierce, thank you for joining [name], NP for today's virtual visit.  While this provider is not your primary care provider (PCP), if your PCP is located in our provider database this encounter information will be shared with them immediately following your visit.   A [facility] MyChart account gives you access to today's visit and all your visits, tests, and labs performed at [facility] " click here if you don't have a [facility] MyChart account or go to [email or web address]  Consent: (Patient) Bethany Pierce provided verbal consent for this virtual visit at the beginning of the encounter.  Current Medications:  Current Outpatient Medications:    valACYclovir (VALTREX) 1000 MG tablet, Take 1 tablet (1,000 mg total) by mouth 2 (two) times daily for 6 days., Disp: 12 tablet, Rfl: 0   acetaminophen (TYLENOL) 500 MG tablet, Take 500 mg by mouth every 6 (six) hours as needed for moderate pain., Disp: , Rfl:    metroNIDAZOLE (FLAGYL) 500 MG tablet, Take 1 tablet (500 mg total) by mouth 2 (two) times daily., Disp: 14 tablet, Rfl: 0   Medications ordered in this encounter:  Meds ordered this encounter  Medications   valACYclovir (VALTREX) 1000 MG tablet    Sig: Take 1 tablet (1,000 mg total) by mouth 2 (two) times daily for 6 days.    Dispense:  12 tablet    Refill:  0    Order Specific Question:   Supervising Provider    Answer:   Bethany Pierce [id number]     *If you need refills on other medications prior to your next appointment, please contact your pharmacy*  Follow-Up: Call back or seek an in-person evaluation if the symptoms worsen or if the condition fails to improve as anticipated.  [facility] [phone]  Other Instructions Genital Herpes Genital herpes is a common sexually transmitted infection (STI) that is caused by a virus. The virus spreads from person to person through contact with a sore, infected saliva, or infected  skin. The virus can cause itching, blisters, and sores around the genitals or rectum. During an outbreak of infection, symptoms may last for several days and then go away. However, the virus remains in the body, so more outbreaks may happen in the future. The time between outbreaks varies and can be from months to years. Genital herpes can affect anyone. It is particularly concerning for pregnant women because the virus can be passed to the baby during delivery. Genital herpes is also a concern for people who have a weak disease-fighting system (immune system). What are the causes? This condition is caused by the herpes simplex virus, type 1 or type 2 (HSV-1 or HSV-2). The virus may spread through: Sexual contact with an infected person, including vaginal, anal, and oral sex. Contact with a herpes sore. The skin. This means that you can get herpes from an infected partner even if there are no blisters or sores present. Your partner may not know that he or she is infected. What increases the risk? You are more likely to develop this condition if: You have sex with many partners. You do not use latex or polyurethane condoms during sex. What are the signs or symptoms? Most people do not have symptoms or they have mild symptoms that may be mistaken for other skin problems. Symptoms may include: Small, red bumps near the genitals, rectum, or mouth. These bumps turn into blisters and then sores. Flu-like (influenza-like) symptoms, including: Fever. Body aches. Swollen  lymph nodes. Headache. Painful urination. Pain and itching in the genital area or rectal area. Vaginal discharge. Tingling or shooting pain in the legs and buttocks. Generally, symptoms are more severe and last longer during the first (primary) outbreak. Influenza-like symptoms are also more common during the primary outbreak. How is this diagnosed? This condition may be diagnosed based on: A physical exam. Your medical  history. Blood tests. A test of a fluid sample (culture) from an open sore. How is this treated? There is no cure for this condition, but treatment with antiviral medicines can do the following: Speed up healing and relieve symptoms. Help to reduce the spread of the virus to sexual partners. Limit the chance of future outbreaks, or make future outbreaks shorter. Lessen symptoms of future outbreaks. Your health care provider may also recommend over-the-counter medicines to help with pain and itching. Follow these instructions at home: If you have an outbreak:  Keep the affected areas dry and clean. Avoid rubbing or touching blisters and sores. If you do touch blisters or sores: Wash your hands thoroughly with soap and water for at least 20 seconds. If soap and water are not available, use an alcohol-based hand sanitizer. Do not touch your eyes afterward. Sexual activity Do not have sexual contact during active outbreaks. Practice safe sex. Herpes can spread even if your partner does not have blisters or sores. Latex or polyurethane condoms and female condoms may help prevent the spread of the herpes virus. Managing pain and discomfort If directed, put ice on the painful area. To do this: Put ice in a plastic bag. Place a towel between your skin and the bag. Leave the ice on for 20 minutes, 2-3 times a day. Remove the ice if your skin turns bright red. This is very important. If you cannot feel pain, heat, or cold, you have a greater risk of damage to the area. If told, take a cool sitz bath to help relieve pain or itching. A sitz bath is a water bath that you take while sitting down in water that is deep enough to cover your hips and buttocks. General instructions Take over-the-counter and prescription medicines only as told by your health care provider. If you were prescribed an antiviral medicine, use it as told by your health care provider. Do not stop using the antiviral even if you  start to feel better. Keep all follow-up visits. This is important. How is this prevented? Use condoms. Although you can get genital herpes during sexual contact even with the use of a condom, a condom can provide some protection. Avoid having multiple sexual partners. Talk with your sexual partner about any symptoms either of you may have. Also, talk with your partner about any history of STIs. Do not have sexual contact if you have active symptoms of genital herpes. Contact a health care provider if: Your symptoms are not improving with medicine. Your symptoms return, or you have new symptoms. You have a fever. You have abdominal pain. You have redness, swelling, or pain in your eye. You notice new sores on other parts of your body. You have had herpes and you become pregnant or plan to become pregnant. Get help right away if: You have symptoms of viral meningitis. This is rare but may happen if the virus spreads to the brain. Symptoms may include: Severe headache or stiff neck. Muscle aches. Nausea and vomiting. Sensitivity to light. Summary Genital herpes is a common sexually transmitted infection (STI) that is caused by the  herpes simplex virus, type 1 or type 2 (HSV-1 or HSV-2). These viruses are most often spread through sexual contact with an infected person. You are more likely to develop this condition if you have sex with many partners or you do not use condoms during sex. Most people do not have symptoms or have mild symptoms that may be mistaken for other skin problems. Symptoms occur as outbreaks that may happen months or years apart. There is no cure for this condition, but treatment with oral antiviral medicines can reduce symptoms, reduce the chance of spreading the virus to a partner, prevent future outbreaks, or shorten future outbreaks. This information is not intended to replace advice given to you by your health care provider. Make sure you discuss any questions you  have with your health care provider. Document Revised: 04/30/2021 Document Reviewed: 04/30/2021 Elsevier Patient Education  2024 Elsevier Inc.    If you have been instructed to have an in-person evaluation today at a local Urgent Care facility, please use the link below. It will take you to a list of all of our available [facility], including address, phone number and hours of operation. Please do not delay care.  [facility]  If you or a family member do not have a primary care provider, use the link below to schedule a visit and establish care. When you choose a [facility] primary care physician or advanced practice provider, you gain a long-term partner in health. Find a Primary Care Provider  Learn more about [facility]'s in-office and virtual care options:  - Get Care Now

## 2023-01-18 ENCOUNTER — Ambulatory Visit: Payer: Medicaid Other | Admitting: Radiology

## 2023-01-18 VITALS — BP 122/88

## 2023-01-18 DIAGNOSIS — Z113 Encounter for screening for infections with a predominantly sexual mode of transmission: Secondary | ICD-10-CM | POA: Diagnosis not present

## 2023-01-18 DIAGNOSIS — N760 Acute vaginitis: Secondary | ICD-10-CM

## 2023-01-18 LAB — WET PREP FOR TRICH, YEAST, CLUE

## 2023-01-18 NOTE — Progress Notes (Signed)
      Subjective: Bethany Pierce is a 22 y.o. female who complains of yellow/brown vaginal discharge, no odor, no itching. Symptoms x's 1 week. Has a new sexual partner.   Review of Systems  All other systems reviewed and are negative.   No past medical history on file.    Objective:  Today's Vitals   01/18/23 1317  BP: 122/88   There is no height or weight on file to calculate BMI.   -General: no acute distress -Vulva: without lesions or discharge -Vagina: discharge present, aptima swab and wet prep obtained -Cervix: no lesion or discharge, no CMT -Perineum: no lesions -Uterus: Mobile, non tender -Adnexa: no masses or tenderness   Microscopic wet-mount exam shows negative for pathogens, normal epithelial cells.   Raynelle Fanning, CMA present for exam  Assessment:/Plan:   1. Acute vaginitis Reassured neg wet prep, will contact with results of STI screen - WET PREP FOR TRICH, YEAST, CLUE  2. Screening for STDs (sexually transmitted diseases) - SURESWAB CT/NG/T. vaginalis    Will contact patient with results of testing completed today. Avoid intercourse until symptoms are resolved. Safe sex encouraged. Avoid the use of soaps or perfumed products in the peri area. Avoid tub baths and sitting in sweaty or wet clothing for prolonged periods of time.

## 2023-01-19 LAB — SURESWAB CT/NG/T. VAGINALIS
C. trachomatis RNA, TMA: NOT DETECTED
N. gonorrhoeae RNA, TMA: NOT DETECTED
Trichomonas vaginalis RNA: NOT DETECTED

## 2023-02-21 ENCOUNTER — Telehealth: Payer: Medicaid Other | Admitting: Family Medicine

## 2023-02-21 DIAGNOSIS — T3695XA Adverse effect of unspecified systemic antibiotic, initial encounter: Secondary | ICD-10-CM | POA: Diagnosis not present

## 2023-02-21 DIAGNOSIS — R3989 Other symptoms and signs involving the genitourinary system: Secondary | ICD-10-CM | POA: Diagnosis not present

## 2023-02-21 DIAGNOSIS — B379 Candidiasis, unspecified: Secondary | ICD-10-CM

## 2023-02-21 MED ORDER — NITROFURANTOIN MONOHYD MACRO 100 MG PO CAPS
100.0000 mg | ORAL_CAPSULE | Freq: Two times a day (BID) | ORAL | 0 refills | Status: AC
Start: 2023-02-21 — End: 2023-02-26

## 2023-02-21 MED ORDER — FLUCONAZOLE 150 MG PO TABS
150.0000 mg | ORAL_TABLET | ORAL | 0 refills | Status: DC
Start: 2023-02-21 — End: 2023-06-06

## 2023-02-21 NOTE — Patient Instructions (Addendum)
Bethany Pierce, thank you for joining Bethany Finner, NP for today's virtual visit.  While this provider is not your primary care provider (PCP), if your PCP is located in our provider database this encounter information will be shared with them immediately following your visit.   A [facility] MyChart account gives you access to today's visit and all your visits, tests, and labs performed at [facility] " click here if you don't have a [facility] MyChart account or go to [email or web address]  Consent: (Patient) Bethany Pierce provided verbal consent for this virtual visit at the beginning of the encounter.  Current Medications:  Current Outpatient Medications:    fluconazole (DIFLUCAN) 150 MG tablet, Take 1 tablet (150 mg total) by mouth as directed., Disp: 1 tablet, Rfl: 0   nitrofurantoin, macrocrystal-monohydrate, (MACROBID) 100 MG capsule, Take 1 capsule (100 mg total) by mouth 2 (two) times daily for 5 days., Disp: 10 capsule, Rfl: 0   acetaminophen (TYLENOL) 500 MG tablet, Take 500 mg by mouth every 6 (six) hours as needed for moderate pain., Disp: , Rfl:    Semaglutide,0.25 or 0.5MG /DOS, (OZEMPIC, 0.25 OR 0.5 MG/DOSE,) 2 MG/1.5ML SOPN, Inject into the skin., Disp: , Rfl:    Medications ordered in this encounter:  Meds ordered this encounter  Medications   nitrofurantoin, macrocrystal-monohydrate, (MACROBID) 100 MG capsule    Sig: Take 1 capsule (100 mg total) by mouth 2 (two) times daily for 5 days.    Dispense:  10 capsule    Refill:  0    Order Specific Question:   Supervising Provider    Answer:   Merrilee Jansky [[id number]]   fluconazole (DIFLUCAN) 150 MG tablet    Sig: Take 1 tablet (150 mg total) by mouth as directed.    Dispense:  1 tablet    Refill:  0    Order Specific Question:   Supervising Provider    Answer:   Merrilee Jansky [id number]     *If you need refills on other medications prior to your next appointment, please contact your  pharmacy*  Follow-Up: Call back or seek an in-person evaluation if the symptoms worsen or if the condition fails to improve as anticipated.   Virtual Care [phone]  Other Instructions Urinary Tract Infection, Adult  A urinary tract infection (UTI) is an infection of any part of the urinary tract. The urinary tract includes the kidneys, ureters, bladder, and urethra. These organs make, store, and get rid of urine in the body. An upper UTI affects the ureters and kidneys. A lower UTI affects the bladder and urethra. What are the causes? Most urinary tract infections are caused by bacteria in your genital area around your urethra, where urine leaves your body. These bacteria grow and cause inflammation of your urinary tract. What increases the risk? You are more likely to develop this condition if: You have a urinary catheter that stays in place. You are not able to control when you urinate or have a bowel movement (incontinence). You are female and you: Use a spermicide or diaphragm for birth control. Have low estrogen levels. Are pregnant. You have certain genes that increase your risk. You are sexually active. You take antibiotic medicines. You have a condition that causes your flow of urine to slow down, such as: An enlarged prostate, if you are female. Blockage in your urethra. A kidney stone. A nerve condition that affects your bladder control (neurogenic bladder). Not getting enough to drink, or not urinating  often. You have certain medical conditions, such as: Diabetes. A weak disease-fighting system (immunesystem). Sickle cell disease. Gout. Spinal cord injury. What are the signs or symptoms? Symptoms of this condition include: Needing to urinate right away (urgency). Frequent urination. This may include small amounts of urine each time you urinate. Pain or burning with urination. Blood in the urine. Urine that smells bad or unusual. Trouble  urinating. Cloudy urine. Vaginal discharge, if you are female. Pain in the abdomen or the lower back. You may also have: Vomiting or a decreased appetite. Confusion. Irritability or tiredness. A fever or chills. Diarrhea. The first symptom in older adults may be confusion. In some cases, they may not have any symptoms until the infection has worsened. How is this diagnosed? This condition is diagnosed based on your medical history and a physical exam. You may also have other tests, including: Urine tests. Blood tests. Tests for STIs (sexually transmitted infections). If you have had more than one UTI, a cystoscopy or imaging studies may be done to determine the cause of the infections. How is this treated? Treatment for this condition includes: Antibiotic medicine. Over-the-counter medicines to treat discomfort. Drinking enough water to stay hydrated. If you have frequent infections or have other conditions such as a kidney stone, you may need to see a health care provider who specializes in the urinary tract (urologist). In rare cases, urinary tract infections can cause sepsis. Sepsis is a life-threatening condition that occurs when the body responds to an infection. Sepsis is treated in the hospital with IV antibiotics, fluids, and other medicines. Follow these instructions at home:  Medicines Take over-the-counter and prescription medicines only as told by your health care provider. If you were prescribed an antibiotic medicine, take it as told by your health care provider. Do not stop using the antibiotic even if you start to feel better. General instructions Make sure you: Empty your bladder often and completely. Do not hold urine for long periods of time. Empty your bladder after sex. Wipe from front to back after urinating or having a bowel movement if you are female. Use each tissue only one time when you wipe. Drink enough fluid to keep your urine pale yellow. Keep all  follow-up visits. This is important. Contact a health care provider if: Your symptoms do not get better after 1-2 days. Your symptoms go away and then return. Get help right away if: You have severe pain in your back or your lower abdomen. You have a fever or chills. You have nausea or vomiting. Summary A urinary tract infection (UTI) is an infection of any part of the urinary tract, which includes the kidneys, ureters, bladder, and urethra. Most urinary tract infections are caused by bacteria in your genital area. Treatment for this condition often includes antibiotic medicines. If you were prescribed an antibiotic medicine, take it as told by your health care provider. Do not stop using the antibiotic even if you start to feel better. Keep all follow-up visits. This is important. This information is not intended to replace advice given to you by your health care provider. Make sure you discuss any questions you have with your health care provider. Document Revised: 03/02/2020 Document Reviewed: 03/07/2020 Elsevier Patient Education  2024 Elsevier Inc.   If you have been instructed to have an in-person evaluation today at a local Urgent Care facility, please use the link below. It will take you to a list of all of our available [facility], including address,  phone number and hours of operation. Please do not delay care.  [facility]  If you or a family member do not have a primary care provider, use the link below to schedule a visit and establish care. When you choose a [facility] primary care physician or advanced practice provider, you gain a long-term partner in health. Find a Primary Care Provider  Learn more about [facility]'s in-office and virtual care options: [facility] - Get Care Now

## 2023-02-21 NOTE — Progress Notes (Signed)
Virtual Visit Consent   Bethany Pierce, you are scheduled for a virtual visit with a [facility] provider today. Just as with appointments in the office, your consent must be obtained to participate. Your consent will be active for this visit and any virtual visit you may have with one of our providers in the next 365 days. If you have a MyChart account, a copy of this consent can be sent to you electronically.  As this is a virtual visit, video technology does not allow for your provider to perform a traditional examination. This may limit your provider's ability to fully assess your condition. If your provider identifies any concerns that need to be evaluated in person or the need to arrange testing (such as labs, EKG, etc.), we will make arrangements to do so. Although advances in technology are sophisticated, we cannot ensure that it will always work on either your end or our end. If the connection with a video visit is poor, the visit may have to be switched to a telephone visit. With either a video or telephone visit, we are not always able to ensure that we have a secure connection.  By engaging in this virtual visit, you consent to the provision of healthcare and authorize for your insurance to be billed (if applicable) for the services provided during this visit. Depending on your insurance coverage, you may receive a charge related to this service.  I need to obtain your verbal consent now. Are you willing to proceed with your visit today? [name] has provided verbal consent on 02/21/2023 for a virtual visit (video or telephone). [name], NP  Date: 02/21/2023 12:39 PM  Virtual Visit via Video Note   I, [name], connected with  Bethany Pierce  ([id number], Oct 17, 2000) on 02/21/23 at 12:45 PM EDT by a video-enabled telemedicine application and verified that I am speaking with the correct person using two identifiers.  Location: Patient: Virtual Visit Location Patient:  Home Provider: Virtual Visit Location Provider: Home Office   I discussed the limitations of evaluation and management by telemedicine and the availability of in person appointments. The patient expressed understanding and agreed to proceed.    History of Present Illness: Bethany Pierce is a 22 y.o. who identifies as a female who was assigned female at birth, and is being seen today for UTI   Onset was with a week ago with pressure sensation but didn't progressed until 2 days ago with pain with urination and increased frequency Associated symptoms are blood in toilet this morning, did notice odor this morning Modifying factors are increased water  Denies chest pain, shortness of breath, fevers, chills, pelvic or back pain Recent intercourse 3 days ago- same partner- no signs of STI  Problems: There are no problems to display for this patient.   Allergies: No Known Allergies Medications:  Current Outpatient Medications:    acetaminophen (TYLENOL) 500 MG tablet, Take 500 mg by mouth every 6 (six) hours as needed for moderate pain., Disp: , Rfl:    Semaglutide,0.25 or 0.5MG /DOS, (OZEMPIC, 0.25 OR 0.5 MG/DOSE,) 2 MG/1.5ML SOPN, Inject into the skin., Disp: , Rfl:   Observations/Objective: Patient is well-developed, well-nourished in no acute distress.  Resting comfortably  at home.  Head is normocephalic, atraumatic.  No labored breathing.  Speech is clear and coherent with logical content.  Patient is alert and oriented at baseline.    Assessment and Plan:  1. Suspected UTI  - nitrofurantoin, macrocrystal-monohydrate, (MACROBID) 100 MG capsule;  Take 1 capsule (100 mg total) by mouth 2 (two) times daily for 5 days.  Dispense: 10 capsule; Refill: 0  2. Antibiotic-induced yeast infection  - fluconazole (DIFLUCAN) 150 MG tablet; Take 1 tablet (150 mg total) by mouth as directed.  Dispense: 1 tablet; Refill: 0  -no other red flags for stone or kidney infection -increase  fluids -complete medication as discussed -prevention discussed and on AVS   Reviewed side effects, risks and benefits of medication.    Patient acknowledged agreement and understanding of the plan.   Past Medical, Surgical, Social History, Allergies, and Medications have been Reviewed.    Follow Up Instructions: I discussed the assessment and treatment plan with the patient. The patient was provided an opportunity to ask questions and all were answered. The patient agreed with the plan and demonstrated an understanding of the instructions.  A copy of instructions were sent to the patient via MyChart unless otherwise noted below.    The patient was advised to call back or seek an in-person evaluation if the symptoms worsen or if the condition fails to improve as anticipated.  Time:  I spent 7 minutes with the patient via telehealth technology discussing the above problems/concerns.    [name], NP

## 2023-03-05 ENCOUNTER — Telehealth: Payer: Medicaid Other | Admitting: Physician Assistant

## 2023-03-05 DIAGNOSIS — B9689 Other specified bacterial agents as the cause of diseases classified elsewhere: Secondary | ICD-10-CM

## 2023-03-05 DIAGNOSIS — N760 Acute vaginitis: Secondary | ICD-10-CM

## 2023-03-05 MED ORDER — METRONIDAZOLE 500 MG PO TABS
500.0000 mg | ORAL_TABLET | Freq: Two times a day (BID) | ORAL | 0 refills | Status: AC
Start: 2023-03-05 — End: 2023-03-12

## 2023-03-05 NOTE — Patient Instructions (Signed)
Bethany Pierce, thank you for joining Bethany Loveless, PA-C for today's virtual visit.  While this provider is not your primary care provider (PCP), if your PCP is located in our provider database this encounter information will be shared with them immediately following your visit.   A [facility] MyChart account gives you access to today's visit and all your visits, tests, and labs performed at [facility] " click here if you don't have a [facility] MyChart account or go to [email or web address]  Consent: (Patient) Bethany Pierce provided verbal consent for this virtual visit at the beginning of the encounter.  Current Medications:  Current Outpatient Medications:    metroNIDAZOLE (FLAGYL) 500 MG tablet, Take 1 tablet (500 mg total) by mouth 2 (two) times daily for 7 days., Disp: 14 tablet, Rfl: 0   acetaminophen (TYLENOL) 500 MG tablet, Take 500 mg by mouth every 6 (six) hours as needed for moderate pain., Disp: , Rfl:    fluconazole (DIFLUCAN) 150 MG tablet, Take 1 tablet (150 mg total) by mouth as directed., Disp: 1 tablet, Rfl: 0   Semaglutide,0.25 or 0.5MG /DOS, (OZEMPIC, 0.25 OR 0.5 MG/DOSE,) 2 MG/1.5ML SOPN, Inject into the skin., Disp: , Rfl:    Medications ordered in this encounter:  Meds ordered this encounter  Medications   metroNIDAZOLE (FLAGYL) 500 MG tablet    Sig: Take 1 tablet (500 mg total) by mouth 2 (two) times daily for 7 days.    Dispense:  14 tablet    Refill:  0    Order Specific Question:   Supervising Provider    Answer:   Merrilee Jansky [id number]     *If you need refills on other medications prior to your next appointment, please contact your pharmacy*  Follow-Up: Call back or seek an in-person evaluation if the symptoms worsen or if the condition fails to improve as anticipated.  [facility] [phone]  Other Instructions Bacterial Vaginosis  Bacterial vaginosis is an infection that occurs when the normal  balance of bacteria in the vagina changes. This change is caused by an overgrowth of certain bacteria in the vagina. Bacterial vaginosis is the most common vaginal infection among females aged 4 to 72 years. This condition increases the risk of sexually transmitted infections (STIs). Treatment can help reduce this risk. Treatment is very important for pregnant women because this condition can cause babies to be born early (prematurely) or at a low birth weight. What are the causes? This condition is caused by an increase in harmful bacteria that are normally present in small amounts in the vagina. However, the exact reason this condition develops is not known. You cannot get bacterial vaginosis from toilet seats, bedding, swimming pools, or contact with objects around you. What increases the risk? The following factors may make you more likely to develop this condition: Having a new sexual partner or multiple sexual partners, or having unprotected sex. Douching. Having an intrauterine device (IUD). Smoking. Abusing drugs and alcohol. This may lead to riskier sexual behavior. Taking certain antibiotic medicines. Being pregnant. What are the signs or symptoms? Some women with this condition have no symptoms. Symptoms may include: Bethany Pierce or white vaginal discharge. The discharge can be watery or foamy. A fish-like odor with discharge, especially after sex or during menstruation. Itching in and around the vagina. Burning or pain with urination. How is this diagnosed? This condition is diagnosed based on: Your medical history. A physical exam of the vagina. Checking a sample of vaginal  fluid for harmful bacteria or abnormal cells. How is this treated? This condition is treated with antibiotic medicines. These may be given as a pill, a vaginal cream, or a medicine that is put into the vagina (suppository). If the condition comes back after treatment, a second round of antibiotics may be  needed. Follow these instructions at home: Medicines Take or apply over-the-counter and prescription medicines only as told by your health care provider. Take or apply your antibiotic medicine as told by your health care provider. Do not stop using the antibiotic even if you start to feel better. General instructions If you have a female sexual partner, tell her that you have a vaginal infection. She should follow up with her health care provider. If you have a female sexual partner, he does not need treatment. Avoid sexual activity until you finish treatment. Drink enough fluid to keep your urine pale yellow. Keep the area around your vagina and rectum clean. Wash the area daily with warm water. Wipe yourself from front to back after using the toilet. If you are breastfeeding, talk to your health care provider about continuing breastfeeding during treatment. Keep all follow-up visits. This is important. How is this prevented? Self-care Do not douche. Wash the outside of your vagina with warm water only. Wear cotton or cotton-lined underwear. Avoid wearing tight pants and pantyhose, especially during the summer. Safe sex Use protection when having sex. This includes: Using condoms. Using dental dams. This is a thin layer of a material made of latex or polyurethane that protects the mouth during oral sex. Limit the number of sexual partners. To help prevent bacterial vaginosis, it is best to have sex with just one partner (monogamous relationship). Make sure you and your sexual partner are tested for STIs. Drugs and alcohol Do not use any products that contain nicotine or tobacco. These products include cigarettes, chewing tobacco, and vaping devices, such as e-cigarettes. If you need help quitting, ask your health care provider. Do not use drugs. Do not drink alcohol if: Your health care provider tells you not to do this. You are pregnant, may be pregnant, or are planning to become  pregnant. If you drink alcohol: Limit how much you have to 0-1 drink a day. Be aware of how much alcohol is in your drink. In the [place] one drink equals one 12 oz bottle of beer (355 mL), one 5 oz glass of wine (148 mL), or one 1 oz glass of hard liquor (44 mL). Where to find more information Centers for Disease Control and Prevention: FootballExhibition.com.br American Sexual Health Association (ASHA): www.ashastd.org U.S. Department of Health and Health and safety inspector, Office on Women's Health: http://hoffman.com/ Contact a health care provider if: Your symptoms do not improve, even after treatment. You have more discharge or pain when urinating. You have a fever or chills. You have pain in your abdomen or pelvis. You have pain during sex. You have vaginal bleeding between menstrual periods. Summary Bacterial vaginosis is a vaginal infection that occurs when the normal balance of bacteria in the vagina changes. It results from an overgrowth of certain bacteria. This condition increases the risk of sexually transmitted infections (STIs). Getting treated can help reduce this risk. Treatment is very important for pregnant women because this condition can cause babies to be born early (prematurely) or at low birth weight. This condition is treated with antibiotic medicines. These may be given as a pill, a vaginal cream, or a medicine that is put into the vagina (suppository). This  information is not intended to replace advice given to you by your health care provider. Make sure you discuss any questions you have with your health care provider. Document Revised: 01/24/2020 Document Reviewed: 01/24/2020 Elsevier Patient Education  2024 Elsevier Inc.    If you have been instructed to have an in-person evaluation today at a local Urgent Care facility, please use the link below. It will take you to a list of all of our available [facility], including address, phone number and hours of operation.  Please do not delay care.  [facility]  If you or a family member do not have a primary care provider, use the link below to schedule a visit and establish care. When you choose a [facility] primary care physician or advanced practice provider, you gain a long-term partner in health. Find a Primary Care Provider  Learn more about [facility]'s in-office and virtual care options: [facility] - Get Care Now

## 2023-03-05 NOTE — Progress Notes (Signed)
Virtual Visit Consent   Bethany Pierce, you are scheduled for a virtual visit with a [facility] provider today. Just as with appointments in the office, your consent must be obtained to participate. Your consent will be active for this visit and any virtual visit you may have with one of our providers in the next 365 days. If you have a MyChart account, a copy of this consent can be sent to you electronically.  As this is a virtual visit, video technology does not allow for your provider to perform a traditional examination. This may limit your provider's ability to fully assess your condition. If your provider identifies any concerns that need to be evaluated in person or the need to arrange testing (such as labs, EKG, etc.), we will make arrangements to do so. Although advances in technology are sophisticated, we cannot ensure that it will always work on either your end or our end. If the connection with a video visit is poor, the visit may have to be switched to a telephone visit. With either a video or telephone visit, we are not always able to ensure that we have a secure connection.  By engaging in this virtual visit, you consent to the provision of healthcare and authorize for your insurance to be billed (if applicable) for the services provided during this visit. Depending on your insurance coverage, you may receive a charge related to this service.  I need to obtain your verbal consent now. Are you willing to proceed with your visit today? Bethany Pierce has provided verbal consent on 03/05/2023 for a virtual visit (video or telephone). Bethany Loveless, PA-C  Date: 03/05/2023 5:37 PM  Virtual Visit via Video Note   I, Bethany Pierce, connected with  Bethany Pierce  ([id number], 2001/01/25) on 03/05/23 at  5:30 PM EDT by a video-enabled telemedicine application and verified that I am speaking with the correct person using two identifiers.  Location: Patient: Virtual Visit Location  Patient: Home Provider: Virtual Visit Location Provider: Home Office   I discussed the limitations of evaluation and management by telemedicine and the availability of in person appointments. The patient expressed understanding and agreed to proceed.    History of Present Illness: Bethany Pierce is a 22 y.o. who identifies as a female who was assigned female at birth, and is being seen today for vaginal discharge.  HPI: Vaginal Discharge The patient's primary symptoms include genital itching, a genital odor and vaginal discharge. This is a recurrent problem. The current episode started 1 to 4 weeks ago (Recently seen virtually on 02/21/23 and treated for UTI and antibiotic induced yeast infection. Last testing in person done on 01/18/23 and was negative for BV, yeast, GC/Chlamydia, and trich; these symptoms were present even before UTI treatment). The problem occurs constantly. The problem has been gradually worsening. The patient is experiencing no pain. Associated symptoms include abdominal pain (suprapubic pressure) and urgency. Pertinent negatives include no back pain, chills, constipation, diarrhea, discolored urine, dysuria, fever, flank pain, frequency, headaches, hematuria, nausea, painful intercourse or vomiting. The vaginal discharge was white, malodorous, yellow, thin and brown. There has been no bleeding. She has not been passing clots. She has not been passing tissue. Nothing aggravates the symptoms. She has tried nothing for the symptoms. The treatment provided no relief. She is sexually active. It is unknown whether or not her partner has an STD.      Problems: There are no problems to display for this patient.   Allergies: No  Known Allergies Medications:  Current Outpatient Medications:    metroNIDAZOLE (FLAGYL) 500 MG tablet, Take 1 tablet (500 mg total) by mouth 2 (two) times daily for 7 days., Disp: 14 tablet, Rfl: 0   acetaminophen (TYLENOL) 500 MG tablet, Take 500 mg by mouth  every 6 (six) hours as needed for moderate pain., Disp: , Rfl:    fluconazole (DIFLUCAN) 150 MG tablet, Take 1 tablet (150 mg total) by mouth as directed., Disp: 1 tablet, Rfl: 0   Semaglutide,0.25 or 0.5MG /DOS, (OZEMPIC, 0.25 OR 0.5 MG/DOSE,) 2 MG/1.5ML SOPN, Inject into the skin., Disp: , Rfl:   Observations/Objective: Patient is well-developed, well-nourished in no acute distress.  Resting comfortably at home.  Head is normocephalic, atraumatic.  No labored breathing.  Speech is clear and coherent with logical content.  Patient is alert and oriented at baseline.    Assessment and Plan: 1. BV (bacterial vaginosis) - metroNIDAZOLE (FLAGYL) 500 MG tablet; Take 1 tablet (500 mg total) by mouth 2 (two) times daily for 7 days.  Dispense: 14 tablet; Refill: 0  - Symptoms consistent with BV - Metronidazole prescribed - Limit bubble baths, scented lotions/soaps/detergents - Limit tight fitting clothing - Advised to consider seeking in person evaluation for STI screen - Seek on person evaluation if not improving or if symptoms worsen   Follow Up Instructions: I discussed the assessment and treatment plan with the patient. The patient was provided an opportunity to ask questions and all were answered. The patient agreed with the plan and demonstrated an understanding of the instructions.  A copy of instructions were sent to the patient via MyChart unless otherwise noted below.    The patient was advised to call back or seek an in-person evaluation if the symptoms worsen or if the condition fails to improve as anticipated.  Time:  I spent 10 minutes with the patient via telehealth technology discussing the above problems/concerns.    Bethany Loveless, PA-C

## 2023-03-26 IMAGING — US US PELVIS COMPLETE TRANSABD/TRANSVAG W DUPLEX AND/OR DOPPLER
1 series · 13 of 25 positions shown · non-contrast
Comparison: CT abdomen pelvis 03/14/2020

CLINICAL DATA: [id number].  Pelvic pain for 3 weeks.

EXAM:
TRANSABDOMINAL AND TRANSVAGINAL ULTRASOUND OF PELVIS
DOPPLER ULTRASOUND OF OVARIES
TECHNIQUE: Both transabdominal and transvaginal ultrasound examinations of the
pelvis were performed. Transabdominal technique was performed for
global imaging of the pelvis including uterus, ovaries, adnexal
regions, and pelvic cul-de-sac.
It was necessary to proceed with endovaginal exam following the
transabdominal exam to visualize the uterus, endometrium, bilateral
ovaries. Color and duplex Doppler ultrasound was utilized to
evaluate blood flow to the ovaries.

[Series 1: us pelvis (transabdominal only) · 13 of 65 slices shown]
[im 1/65]
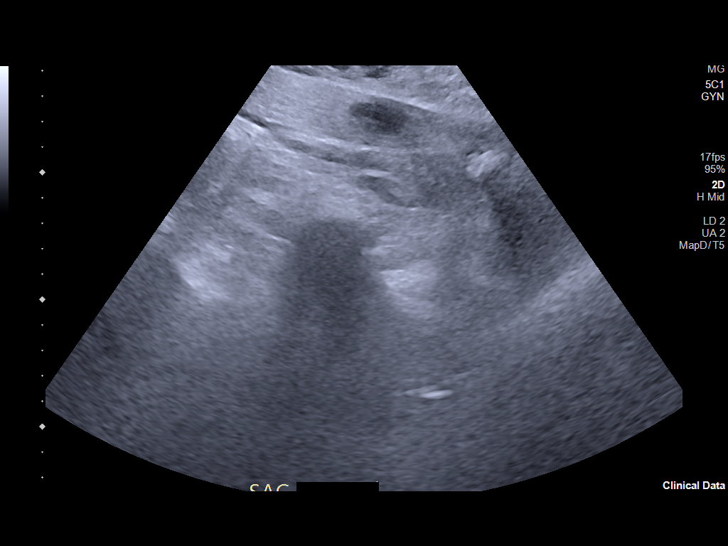
[im 6/65]
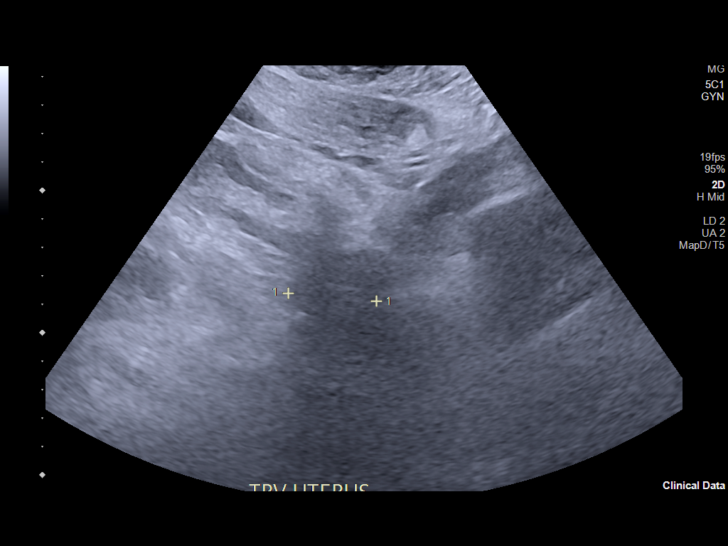
[im 11/65]
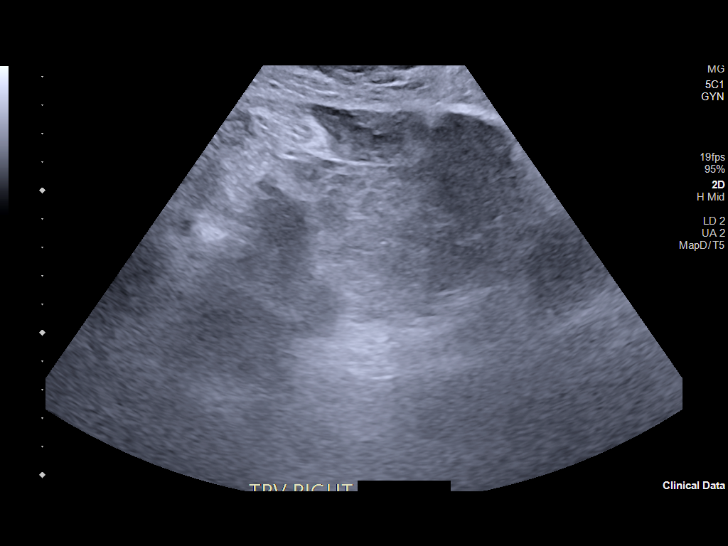
[im 17/65]
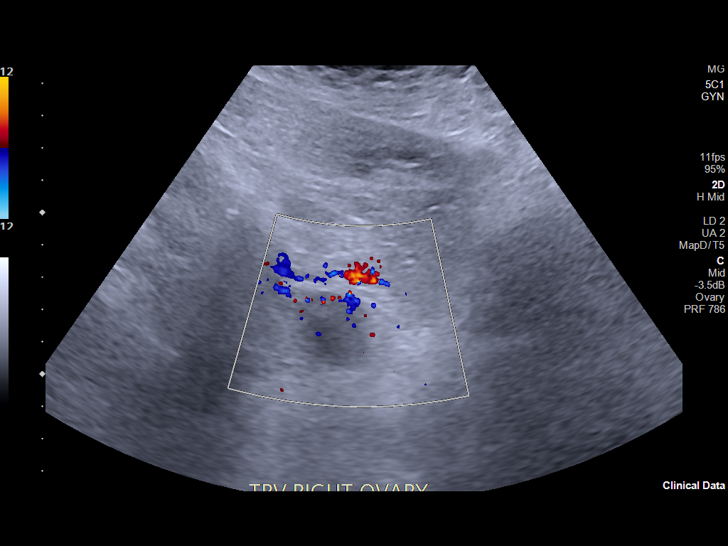
[im 22/65]
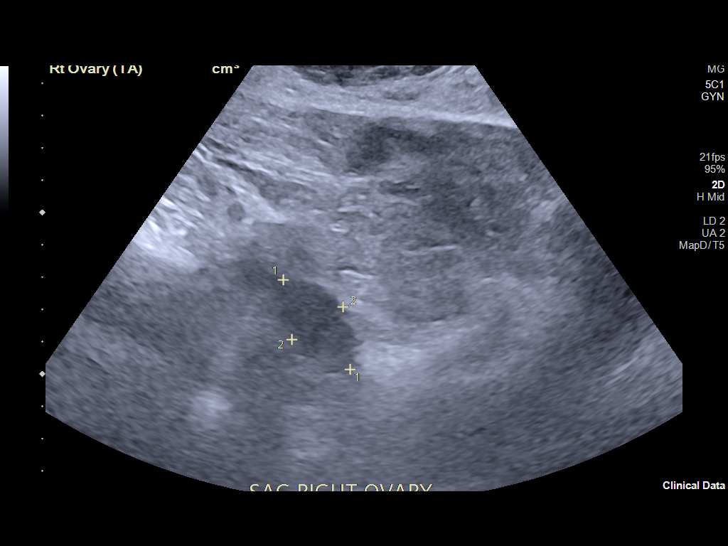
[im 27/65]
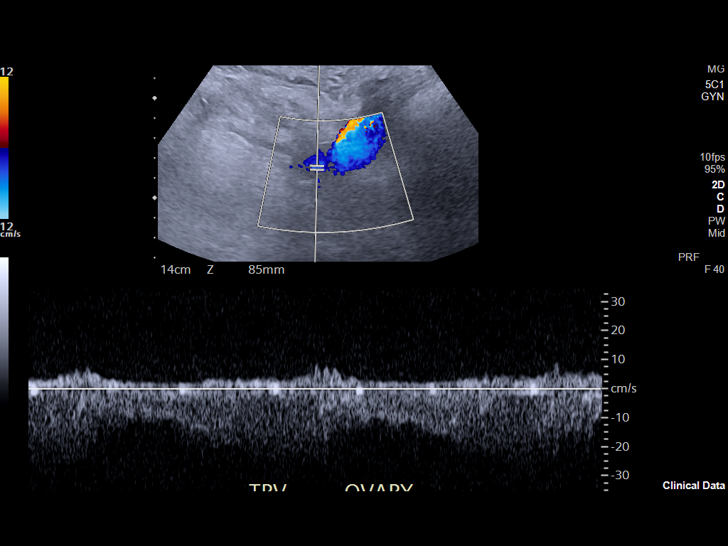
[im 33/65]
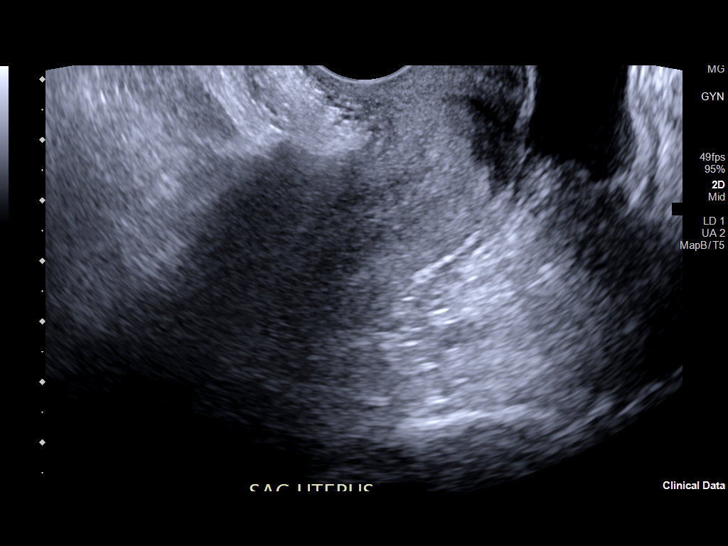
[im 38/65]
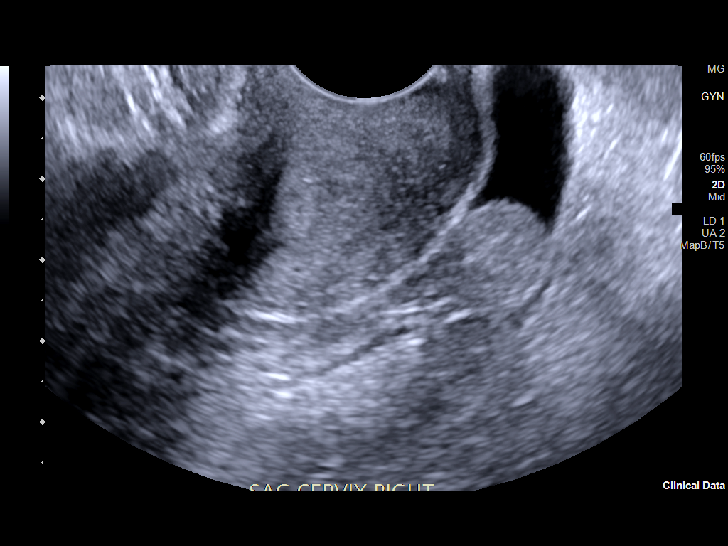
[im 43/65]
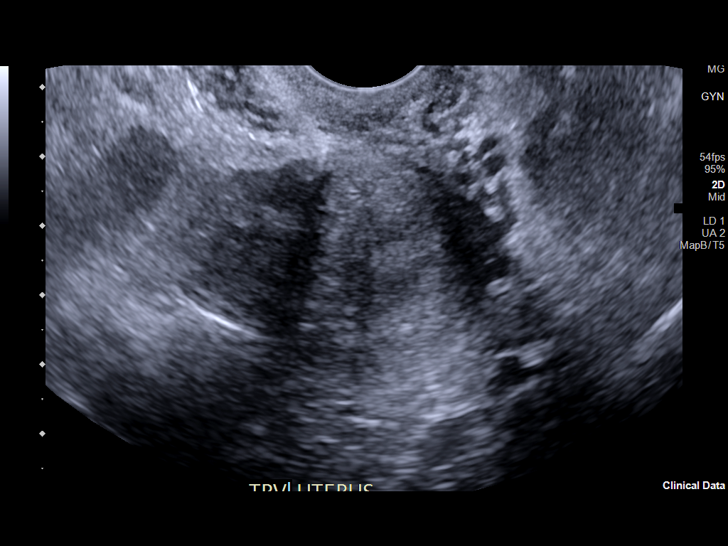
[im 49/65]
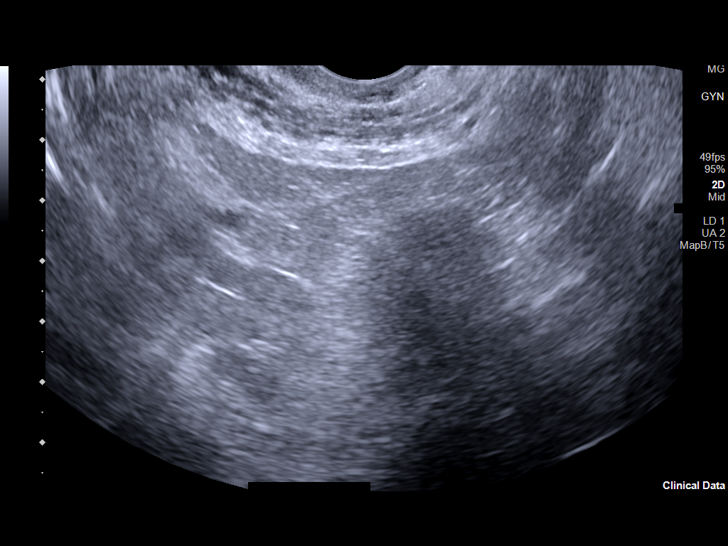
[im 54/65]
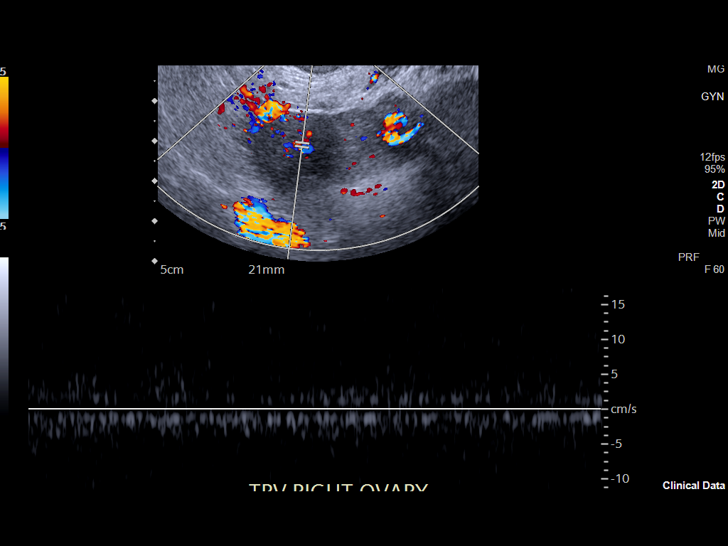
[im 59/65]
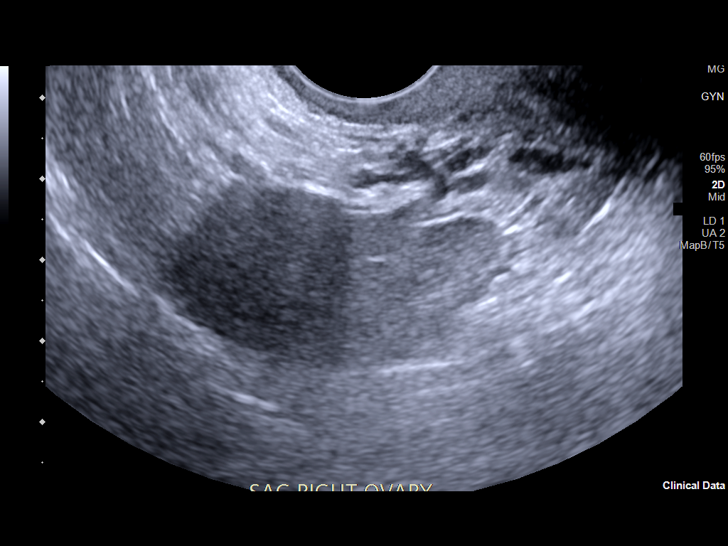
[im 65/65]
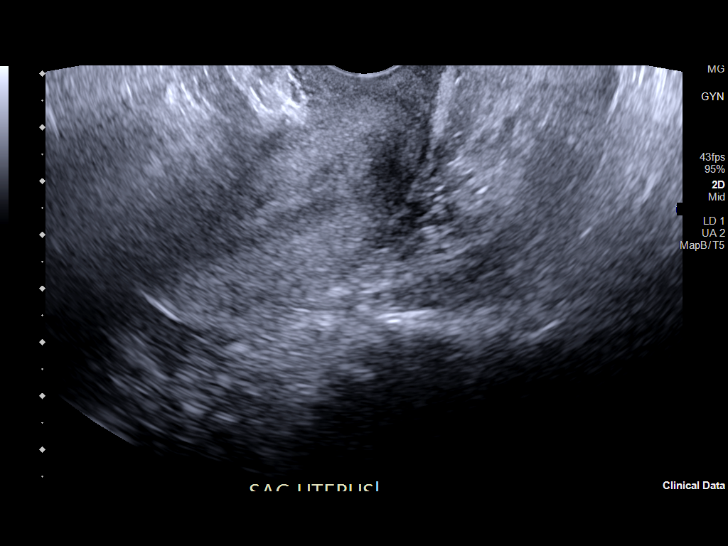

[13 of 25 positions shown; findings below may reference images not displayed]

FINDINGS: Uterus

Measurements: 6.1 x 3.2 x 3.4 cm = volume: 35 mL. No fibroids or
other mass visualized.

Endometrium

Thickness: 4 mm.  No focal abnormality visualized.

Right ovary

Measurements: 2.6 x 1.9 x 2.4 cm = volume: 6.2 mL. Normal
appearance/no adnexal mass.

Left ovary

Measurements: 4 x 1.7 x 2 cm = volume: 7.4 mL. Normal appearance/no
adnexal mass.

Pulsed Doppler evaluation of both ovaries demonstrates normal
low-resistance arterial and venous waveforms.

Other findings

At least small volume simple free fluid.
IMPRESSION: At least small volume pelvic simple free fluid. Please consider CT
with intravenous contrast for further evaluation.

## 2023-04-22 ENCOUNTER — Ambulatory Visit: Payer: Medicaid Other | Admitting: Radiology

## 2023-05-10 ENCOUNTER — Ambulatory Visit: Payer: Medicaid Other | Admitting: Radiology

## 2023-05-30 ENCOUNTER — Ambulatory Visit: Payer: Medicaid Other | Admitting: *Deleted

## 2023-05-30 ENCOUNTER — Telehealth: Payer: Self-pay | Admitting: Clinical

## 2023-05-30 ENCOUNTER — Other Ambulatory Visit (INDEPENDENT_AMBULATORY_CARE_PROVIDER_SITE_OTHER): Payer: Self-pay

## 2023-05-30 ENCOUNTER — Ambulatory Visit: Payer: Medicaid Other

## 2023-05-30 VITALS — BP 132/80 | HR 80 | Wt 379.1 lb

## 2023-05-30 DIAGNOSIS — Z1339 Encounter for screening examination for other mental health and behavioral disorders: Secondary | ICD-10-CM

## 2023-05-30 DIAGNOSIS — Z3A10 10 weeks gestation of pregnancy: Secondary | ICD-10-CM

## 2023-05-30 DIAGNOSIS — Z3480 Encounter for supervision of other normal pregnancy, unspecified trimester: Secondary | ICD-10-CM | POA: Insufficient documentation

## 2023-05-30 DIAGNOSIS — Z3401 Encounter for supervision of normal first pregnancy, first trimester: Secondary | ICD-10-CM

## 2023-05-30 DIAGNOSIS — O3680X0 Pregnancy with inconclusive fetal viability, not applicable or unspecified: Secondary | ICD-10-CM

## 2023-05-30 DIAGNOSIS — Z3A11 11 weeks gestation of pregnancy: Secondary | ICD-10-CM | POA: Diagnosis not present

## 2023-05-30 DIAGNOSIS — O0990 Supervision of high risk pregnancy, unspecified, unspecified trimester: Secondary | ICD-10-CM | POA: Insufficient documentation

## 2023-05-30 MED ORDER — BLOOD PRESSURE KIT DEVI
1.0000 | 0 refills | Status: DC
Start: 2023-05-30 — End: 2023-07-22

## 2023-05-30 NOTE — Telephone Encounter (Signed)
Attempt call regarding referral; Left HIPPA-compliant message to call back Mikle Sternberg from [facility] at  [phone] (Ashelyn Mccravy's office).    

## 2023-05-30 NOTE — Patient Instructions (Signed)
The [facility] [employer] has a partnership with the [employer] to provide prenatal navigation for the most needed resources in our community. In order to see how we can help connect you to these resources we need consent to contact you. Please complete the very short consent using the link below:   English Link: https://[email or web address].tfaforms.[email or web address]?site=16  Spanish Link: https://[place].tfaforms.net/287?site=16  Options for Doula Care in the [place] Area  As you review your birthing options, consider having a birth doula. A doula is trained to provide support before, during and just after you give birth. There are also postpartum doulas that help you adjust to new parenthood.  While doulas do not provide medical care, they do provide emotional, physical and educational support. A few months before your baby arrives, doulas can help answer questions, ease concerns and help you create and support your birthing plan.    Doulas can help reduce your stress and comfort you and your partner. They can help you cope with labor by helping you use breathing techniques, massage, creative labor positioning, essential oils and affirmations.   Studies show that the benefits of having a doula include:   A more positive birth experience  Fewer requests for pain-relief medication  Less likelihood of cesarean section, commonly called a c-section   Doulas are typically hired via a [employer] account planner between you and the doula. We are happy to provide a list of the most active doulas in the area, all of whom are credentialed by [facility] and will not count as a visitor at your birth.  There are several options for no-cost doula care at our hospital, including:  [facility] Volunteer Doula Program Every [employer] Program A Cure 4 Moms Doula Study (available only at [employer] for Women, [place], [place] and [facility] offices)  For more information on these programs or to receive  a list of doulas active in our area, please email [email or web address] [email or web address]  Our practice his participating in a study that provides no-cost doula care. ACURE4Moms is a study looking at how doula care can reduce birthing disparities for Black and brown birthing people. We like to refer patients as soon as possible, but definitely before 28 weeks so patients can get to know their doula.    A doula is trained to provide support before, during and just after you give birth. While doulas do not provide medical care, they do provide emotional, physical and educational support. Doulas can help reduce your stress and comfort you and your partner. They can help you cope with labor by helping you use breathing techniques, massage, creative labor positioning, essential oils and affirmations.   ACURE4Moms is a research study trying to reduce:   low birthweight babies  emergency department visits & hospitalizations for birthing persons and their babies  depression among birthing people  discrimination in pregnancy-related care ACURE4Moms is trying out 2 programs designed by  people who have given birth. These programs include: 1. Sharing patient data and warning alerts with clinic staff to keep them accountable for their patients' outcomes and providing tools to help them  reduce bias in care. 2. Matching eligible patients with doulas from the  same community as the patients.  If you would like to participate in this study, please visit:   http://carroll-castaneda.info/

## 2023-05-30 NOTE — Progress Notes (Signed)
New OB Intake  I connected with Bethany Pierce  on 05/30/23 at  8:15 AM EDT by In Person Visit and verified that I am speaking with the correct person using two identifiers. Nurse is located at [facility] and pt is located at [place].  I discussed the limitations, risks, security and privacy concerns of performing an evaluation and management service by telephone and the availability of in person appointments. I also discussed with the patient that there may be a patient responsible charge related to this service. The patient expressed understanding and agreed to proceed.  I explained I am completing New OB Intake today. We discussed EDD of Not found.. Pt is G0P0000. I reviewed her allergies, medications and Medical/Surgical/OB history.    There are no problems to display for this patient.   Concerns addressed today  Delivery Plans Plans to deliver at [facility]. Discussed the nature of our practice with multiple providers including residents and students. Due to the size of the practice, the delivering provider may not be the same as those providing prenatal care.   Patient is not interested in water birth. Offered upcoming OB visit with CNM to discuss further.  MyChart/Babyscripts MyChart access verified. I explained pt will have some visits in office and some virtually. Babyscripts instructions given and order placed. Patient verifies receipt of registration text/e-mail. Account successfully created and app downloaded.  Blood Pressure Cuff/Weight Scale Blood pressure cuff ordered for patient to pick-up from [employer]. Explained after first prenatal appt pt will check weekly and document in Babyscripts. Patient does not have weight scale; patient may purchase if they desire to track weight weekly in Babyscripts.  Anatomy US Explained first scheduled [place] will be around 19 weeks. Anatomy US scheduled for TBD at TBD.  Interested in [place]? If yes, send referral and doula dot phrase.   Is  patient a candidate for Babyscripts Optimization? Yes  First visit review I reviewed new OB appt with patient. Explained pt will be seen by Sharen Counter, CNM at first visit. Discussed Avelina Laine genetic screening with patient. Requests Panorama and Horizon.. Routine prenatal labs  collected at today's visit.    Last Pap No results found for: "DIAGPAP"  Harrel Lemon, RN 05/30/2023  8:19 AM

## 2023-05-31 LAB — CBC/D/PLT+RPR+RH+ABO+RUBIGG...
Antibody Screen: NEGATIVE
Basophils Absolute: 0 10*3/uL (ref 0.0–0.2)
Basos: 0 %
EOS (ABSOLUTE): 0 10*3/uL (ref 0.0–0.4)
Eos: 0 %
HCV Ab: NONREACTIVE
HIV Screen 4th Generation wRfx: NONREACTIVE
Hematocrit: 37.7 % (ref 34.0–46.6)
Hemoglobin: 12.1 g/dL (ref 11.1–15.9)
Hepatitis B Surface Ag: NEGATIVE
Immature Grans (Abs): 0 10*3/uL (ref 0.0–0.1)
Immature Granulocytes: 0 %
Lymphocytes Absolute: 1.7 10*3/uL (ref 0.7–3.1)
Lymphs: 20 %
MCH: 30 pg (ref 26.6–33.0)
MCHC: 32.1 g/dL (ref 31.5–35.7)
MCV: 93 fL (ref 79–97)
Monocytes Absolute: 0.4 10*3/uL (ref 0.1–0.9)
Monocytes: 4 %
Neutrophils Absolute: 6.1 10*3/uL (ref 1.4–7.0)
Neutrophils: 76 %
Platelets: 309 10*3/uL (ref 150–450)
RBC: 4.04 x10E6/uL (ref 3.77–5.28)
RDW: 12.5 % (ref 11.7–15.4)
RPR Ser Ql: NONREACTIVE
Rh Factor: POSITIVE
Rubella Antibodies, IGG: 2.52 {index} (ref >0.99)
WBC: 8.2 10*3/uL (ref 3.4–10.8)

## 2023-05-31 LAB — HCV INTERPRETATION

## 2023-05-31 LAB — HEMOGLOBIN A1C
Est. average glucose Bld gHb Est-mCnc: 111 mg/dL
Hgb A1c MFr Bld: 5.5 % (ref 4.8–5.6)

## 2023-06-01 LAB — CULTURE, OB URINE

## 2023-06-01 LAB — URINE CULTURE, OB REFLEX

## 2023-06-05 LAB — PANORAMA PRENATAL TEST FULL PANEL:PANORAMA TEST PLUS 5 ADDITIONAL MICRODELETIONS: FETAL FRACTION: 6.2

## 2023-06-06 ENCOUNTER — Other Ambulatory Visit (HOSPITAL_COMMUNITY)
Admission: RE | Admit: 2023-06-06 | Discharge: 2023-06-06 | Disposition: A | Discharge status: Home / Self Care | Payer: Medicaid Other | Source: Ambulatory Visit | Attending: Obstetrics and Gynecology | Admitting: Obstetrics and Gynecology

## 2023-06-06 ENCOUNTER — Encounter: Payer: Self-pay | Admitting: Advanced Practice Midwife

## 2023-06-06 ENCOUNTER — Ambulatory Visit (INDEPENDENT_AMBULATORY_CARE_PROVIDER_SITE_OTHER): Payer: Medicaid Other | Admitting: Advanced Practice Midwife

## 2023-06-06 VITALS — BP 129/82 | HR 78 | Wt 382.0 lb

## 2023-06-06 DIAGNOSIS — Z23 Encounter for immunization: Secondary | ICD-10-CM | POA: Diagnosis not present

## 2023-06-06 DIAGNOSIS — Z124 Encounter for screening for malignant neoplasm of cervix: Secondary | ICD-10-CM | POA: Insufficient documentation

## 2023-06-06 DIAGNOSIS — Z3A12 12 weeks gestation of pregnancy: Secondary | ICD-10-CM

## 2023-06-06 DIAGNOSIS — O99210 Obesity complicating pregnancy, unspecified trimester: Secondary | ICD-10-CM | POA: Insufficient documentation

## 2023-06-06 DIAGNOSIS — Z6841 Body Mass Index (BMI) 40.0 and over, adult: Secondary | ICD-10-CM | POA: Diagnosis not present

## 2023-06-06 DIAGNOSIS — O99211 Obesity complicating pregnancy, first trimester: Secondary | ICD-10-CM

## 2023-06-06 DIAGNOSIS — O0990 Supervision of high risk pregnancy, unspecified, unspecified trimester: Secondary | ICD-10-CM | POA: Diagnosis present

## 2023-06-06 DIAGNOSIS — A6000 Herpesviral infection of urogenital system, unspecified: Secondary | ICD-10-CM

## 2023-06-06 DIAGNOSIS — O0991 Supervision of high risk pregnancy, unspecified, first trimester: Secondary | ICD-10-CM

## 2023-06-06 MED ORDER — VALACYCLOVIR HCL 1 G PO TABS: 1000.0000 mg | ORAL_TABLET | Freq: Two times a day (BID) | ORAL | 5 refills | Status: AC

## 2023-06-06 MED ORDER — ASPIRIN 81 MG PO TBEC: 81.0000 mg | DELAYED_RELEASE_TABLET | Freq: Every day | ORAL | 2 refills | Status: DC

## 2023-06-06 NOTE — Progress Notes (Signed)
Subjective:   Adrieana Meucci is a 22 y.o. G1P0000 at [redacted]w[redacted]d by LMP being seen today for her first obstetrical visit.  Her obstetrical history is significant for obesity and has Supervision of high risk pregnancy, antepartum; Obesity affecting pregnancy, antepartum; and BMI 60.0-69.9, adult (HCC) on their problem list.. Patient does intend to breast feed. Pregnancy history fully reviewed.  Patient reports nausea.  HISTORY: OB History  Gravida Para Term Preterm AB Living  1 0 0 0 0 0  SAB IAB Ectopic Multiple Live Births  0 0 0 0 0    # Outcome Date GA Lbr Len/2nd Weight Sex Type Anes PTL Lv  1 Current            Past Medical History:  Diagnosis Date   Medical history non-contributory    Past Surgical History:  Procedure Laterality Date   NO PAST SURGERIES     Family History  Problem Relation Age of Onset   Diabetes Mother    Hypertension Father    Diabetes Maternal Grandmother    Hypertension Paternal Grandmother    Social History   Tobacco Use   Smoking status: Never    Passive exposure: Past   Smokeless tobacco: Never  Vaping Use   Vaping status: Never Used  Substance Use Topics   Alcohol use: Not Currently    Comment: occ   Drug use: Never   No Known Allergies Current Outpatient Medications on File Prior to Visit  Medication Sig Dispense Refill   acetaminophen (TYLENOL) 500 MG tablet Take 500 mg by mouth every 6 (six) hours as needed for moderate pain.     Blood Pressure Monitoring (BLOOD PRESSURE KIT) DEVI 1 Device by Does not apply route once a week. 1 each 0   Prenatal Vit-Fe Fumarate-FA (PRENATAL VITAMIN PO) Take 1 tablet by mouth daily.     No current facility-administered medications on file prior to visit.     Indications for ASA therapy (per uptodate) One of the following: Previous pregnancy with preeclampsia, especially early onset and with an adverse outcome No Multifetal gestation No Chronic hypertension No Type 1 or 2 diabetes mellitus  No Chronic kidney disease No Autoimmune disease (antiphospholipid syndrome, systemic lupus erythematosus) No   Two or more of the following: Nulliparity Yes Obesity (body mass index >30 kg/m2) Yes   Indications for early 1 hour GTT (per uptodate)  BMI >25 (>23 in Asian women) AND one of the following  Gestational diabetes mellitus in a previous pregnancy No Glycated hemoglobin >=5.7 percent (39 mmol/mol), impaired glucose tolerance, or impaired fasting glucose on previous testing No First-degree relative with diabetes No High-risk race/ethnicity (eg, African American, Latino, Native American, Asian American, Pacific Islander) Yes Exam   Vitals:   06/06/23 1051  BP: 129/82  Pulse: 78  Weight: (!) 382 lb (173.3 kg)   Fetal Heart Rate (bpm): 156  Uterus:     Pelvic Exam: Perineum: no hemorrhoids, normal perineum   Vulva: normal external genitalia, no lesions   Vagina:  normal mucosa, normal discharge   Cervix: no lesions and normal, pap smear done.    Adnexa: normal adnexa and no mass, fullness, tenderness   Bony Pelvis: average  System: General: well-developed, well-nourished female in no acute distress   Breast:  normal appearance, no masses or tenderness   Skin: normal coloration and turgor, no rashes   Neurologic: oriented, normal, negative, normal mood   Extremities: normal strength, tone, and muscle mass, ROM of all joints  is normal   HEENT PERRLA, extraocular movement intact and sclera clear, anicteric   Mouth/Teeth mucous membranes moist, pharynx normal without lesions and dental hygiene good   Neck supple and no masses   Cardiovascular: regular rate and rhythm   Respiratory:  no respiratory distress, normal breath sounds   Abdomen: soft, non-tender; bowel sounds normal; no masses,  no organomegaly     Assessment:   Pregnancy: G1P0000 Patient Active Problem List   Diagnosis Date Noted   Obesity affecting pregnancy, antepartum 06/06/2023   BMI 60.0-69.9, adult  (HCC) 06/06/2023   Supervision of high risk pregnancy, antepartum 05/30/2023     Plan:  1. Supervision of high risk pregnancy, antepartum --Anticipatory guidance about next visits/weeks of pregnancy given.  - Cervicovaginal ancillary only( [place]) - Flu vaccine trivalent PF, 6mos and older(Flulaval,Afluria,Fluarix,Fluzone)  2. Encounter for screening for cervical cancer  - Cytology - PAP( [place])  3. BMI 60.0-69.9, adult (HCC)   4. Obesity affecting pregnancy, antepartum, unspecified obesity type --Baseline PEC labs --Baby ASA daily - Comp Met (CMET) - Protein / creatinine ratio, urine   5. Recurrent genital HSV (herpes simplex virus) infection --Prodromal symptoms but no outbreak 2 weeks ago, last outbreak 5 months ago --Valtrex tx dose in case of outbreak, discussed prophylaxis at 34-36 weeks  - valACYclovir (VALTREX) 1000 MG tablet; Take 1 tablet (1,000 mg total) by mouth 2 (two) times daily for 5 days.  Dispense: 10 tablet; Refill: 5  Initial labs reviewed Continue prenatal vitamins. Discussed and offered genetic screening options, including Quad screen/AFP, NIPS testing, and option to decline testing. Benefits/risks/alternatives reviewed. Pt aware that anatomy US is form of genetic screening with lower accuracy in detecting trisomies than blood work. NIPS: results reviewed. Ultrasound discussed; fetal anatomic survey: ordered. Problem list reviewed and updated. The nature of [facility] with multiple MDs and other Advanced Practice Providers was explained to patient; also emphasized that residents, students are part of our team. Routine obstetric precautions reviewed. No follow-ups on file.   Sharen Counter, CNM 06/06/23 12:48 PM

## 2023-06-06 NOTE — Progress Notes (Signed)
Pt presents for NOB visit. No PAP Hx. Labs drawn at intake. [place] scheduled

## 2023-06-07 ENCOUNTER — Encounter: Payer: Self-pay | Admitting: Advanced Practice Midwife

## 2023-06-08 LAB — HORIZON CUSTOM: REPORT SUMMARY: NEGATIVE

## 2023-06-08 LAB — CERVICOVAGINAL ANCILLARY ONLY
Chlamydia: NEGATIVE
Comment: NEGATIVE
Comment: NEGATIVE
Comment: NORMAL
Neisseria Gonorrhea: NEGATIVE
Trichomonas: NEGATIVE

## 2023-06-09 LAB — CYTOLOGY - PAP: Diagnosis: NEGATIVE

## 2023-06-14 NOTE — BH Specialist Note (Signed)
Integrated Behavioral Health via Telemedicine Visit  06/27/2023 Marua Zigan [id number]  Number of Integrated Behavioral Health Clinician visits: 1- Initial Visit  Session Start time: 1015   Session End time: 1101  Total time in minutes: 46   Referring Provider: Catalina Antigua, MD Patient/Family location[facility] Provider location: [facility]  All persons participating in visit: Patient Shaili Parks and [facility]ese[place]   Types of Service: Individual psychotherapy and Video visit  I connected with [name] and/or Kiki Lanphere's  n/a  via  Telephone or Video Enabled Telemedicine Application  (Video is Caregility application) and verified that I am speaking with the correct person using two identifiers. Discussed confidentiality: Yes   I discussed the limitations of telemedicine and the availability of in person appointments.  Discussed there is a possibility of technology failure and discussed alternative modes of communication if that failure occurs.  I discussed that engaging in this telemedicine visit, they consent to the provision of behavioral healthcare and the services will be billed under their insurance.  Patient and/or legal guardian expressed understanding and consented to Telemedicine visit: Yes   Presenting Concerns: Patient and/or family reports the following symptoms/concerns: Increased anxiety with positive pregnancy, attributes to financial stress and adjusting to pregnancy; pt has coped by making decision to move back home with mom to ease financial stress; good support from family, friends and boyfriend; open to implementing self-coping strategy today.  Duration of problem: Current pregnancy; Severity of problem: moderate  Patient and/or Family's Strengths/Protective Factors: Social connections, Concrete supports in place (healthy food, safe environments, etc.), and Sense of purpose  Goals  Addressed: Patient will:  Reduce symptoms of: anxiety and stress   Increase knowledge and/or ability of: healthy habits and self-management skills   Demonstrate ability to: Increase healthy adjustment to current life circumstances  Progress towards Goals: Ongoing  Interventions: Interventions utilized:  Mindfulness or Management consultant, Psychoeducation and/or Health Education, Link to [employer], and Supportive Reflection Standardized Assessments completed: Not Needed  Patient and/or Family Response: Patient agrees with treatment plan.   Assessment: Patient currently experiencing Adjustment disorder with anxious mood.   Patient may benefit from psychoeducation and brief therapeutic interventions regarding coping with symptoms of anxiety  .  Plan: Follow up with behavioral health clinician on : One month Behavioral recommendations:  -Continue taking prenatal vitamin as prescribed -Continue plan to pick up blood pressure monitor when available at pharmacy -CALM relaxation breathing exercise twice daily (morning; at bedtime); as needed throughout the day. -Read through additional resources on After Visit Summary; use as needed -Continue plan to inquire about maternity leave and short-term disability benefits at work Referral(s): Integrated Art gallery manager (In Clinic) and [employer]:  childcare  I discussed the assessment and treatment plan with the patient and/or parent/guardian. They were provided an opportunity to ask questions and all were answered. They agreed with the plan and demonstrated an understanding of the instructions.   They were advised to call back or seek an in-person evaluation if the symptoms worsen or if the condition fails to improve as anticipated.  Rae Lips, LCSW     05/30/2023    8:46 AM  Depression screen PHQ 2/9  Decreased Interest 0  Down, Depressed, Hopeless 1  PHQ - 2 Score 1  Altered sleeping 0  Tired,  decreased energy 0  Change in appetite 0  Feeling bad or failure about yourself  0  Trouble concentrating 0  Moving slowly or fidgety/restless 0  Suicidal thoughts 0  PHQ-9 Score 1      05/30/2023    8:47 AM  GAD 7 : Generalized Anxiety Score  Nervous, Anxious, on Edge 1  Control/stop worrying 3  Worry too much - different things 0  Trouble relaxing 1  Restless 2  Easily annoyed or irritable 1  Afraid - awful might happen 3  Total GAD 7 Score 11

## 2023-06-16 ENCOUNTER — Other Ambulatory Visit: Payer: Self-pay

## 2023-06-16 DIAGNOSIS — R0602 Shortness of breath: Secondary | ICD-10-CM

## 2023-06-27 ENCOUNTER — Ambulatory Visit (INDEPENDENT_AMBULATORY_CARE_PROVIDER_SITE_OTHER): Payer: Medicaid Other | Admitting: Clinical

## 2023-06-27 DIAGNOSIS — F4322 Adjustment disorder with anxiety: Secondary | ICD-10-CM

## 2023-06-27 NOTE — Patient Instructions (Signed)
Center for [facility] at [facility] [place], [place] [address] [phone] (main office) [phone] [facility] office)  [email or web address]  Armed forces training and education officer  (Childcare options, Early childcare development, etc.) [email or web address]  [place]  [email or web address]

## 2023-07-04 ENCOUNTER — Ambulatory Visit (INDEPENDENT_AMBULATORY_CARE_PROVIDER_SITE_OTHER): Payer: Medicaid Other | Admitting: Obstetrics & Gynecology

## 2023-07-04 VITALS — BP 130/74 | HR 85 | Wt 389.0 lb

## 2023-07-04 DIAGNOSIS — O0990 Supervision of high risk pregnancy, unspecified, unspecified trimester: Secondary | ICD-10-CM

## 2023-07-04 DIAGNOSIS — O99210 Obesity complicating pregnancy, unspecified trimester: Secondary | ICD-10-CM

## 2023-07-04 DIAGNOSIS — Z3A16 16 weeks gestation of pregnancy: Secondary | ICD-10-CM

## 2023-07-04 NOTE — Progress Notes (Signed)
   PRENATAL VISIT NOTE  Subjective:  Bethany Pierce is a 22 y.o. G1P0000 at [redacted]w[redacted]d being seen today for ongoing prenatal care.  She is currently monitored for the following issues for this high-risk pregnancy and has Supervision of high risk pregnancy, antepartum; Obesity affecting pregnancy, antepartum; and BMI 60.0-69.9, adult (HCC) on their problem list.  Patient reports  pedal edema .  Contractions: Not present. Vag. Bleeding: None.   . Denies leaking of fluid.   The following portions of the patient's history were reviewed and updated as appropriate: allergies, current medications, past family history, past medical history, past social history, past surgical history and problem list.   Objective:   Vitals:   07/04/23 0950  BP: 130/74  Pulse: 85  Weight: (!) 389 lb (176.4 kg)    Fetal Status: Fetal Heart Rate (bpm): 150         General:  Alert, oriented and cooperative. Patient is in no acute distress.  Skin: Skin is warm and dry. No rash noted.   Cardiovascular: Normal heart rate noted  Respiratory: Normal respiratory effort, no problems with respiration noted  Abdomen: Soft, gravid, appropriate for gestational age.  Pain/Pressure: Absent     Pelvic: Cervical exam deferred        Extremities: Normal range of motion.  Edema: Trace  Mental Status: Normal mood and affect. Normal behavior. Normal judgment and thought content.   Assessment and Plan:  Pregnancy: G1P0000 at [redacted]w[redacted]d 1. Supervision of high risk pregnancy, antepartum  - AFP, Serum, Open Spina Bifida - Comprehensive metabolic panel - Protein / creatinine ratio, urine  2. [redacted] weeks gestation of pregnancy Recommend support hose - AFP, Serum, Open Spina Bifida  3. Obesity affecting pregnancy, antepartum, unspecified obesity type   Preterm labor symptoms and general obstetric precautions including but not limited to vaginal bleeding, contractions, leaking of fluid and fetal movement were reviewed in detail with the  patient. Please refer to After Visit Summary for other counseling recommendations.   Return in about 4 weeks (around 08/01/2023).  Future Appointments  Date Time Provider Department Center  07/22/2023  1:40 PM Thomasene Ripple, DO [facility] None  07/29/2023 10:15 AM [facility]-BEHAVIORAL HEALTH CLINICIAN [facility]  08/01/2023  8:55 AM Sue Lush, FNP [facility] None  08/09/2023  8:15 AM [facility] NURSE [facility]  08/09/2023  8:30 AM [facility] US1 [facility]    Scheryl Darter, MD

## 2023-07-04 NOTE — Progress Notes (Signed)
Pt complains of swelling in LE, left more than right.  Pt states she is drinking plenty.

## 2023-07-04 NOTE — Progress Notes (Signed)
CMP and PCR ordered last visit but not collected.

## 2023-07-05 LAB — COMPREHENSIVE METABOLIC PANEL
ALT: 23 [IU]/L (ref 0–32)
AST: 29 [IU]/L (ref 0–40)
Albumin: 3.6 g/dL — ABNORMAL LOW (ref 4.0–5.0)
Alkaline Phosphatase: 67 [IU]/L (ref 44–121)
BUN/Creatinine Ratio: 10 (ref 9–23)
BUN: 7 mg/dL (ref 6–20)
Bilirubin Total: <0.2 mg/dL (ref 0.0–1.2)
CO2: 18 mmol/L — ABNORMAL LOW (ref 20–29)
Calcium: 9.3 mg/dL (ref 8.7–10.2)
Chloride: 106 mmol/L (ref 96–106)
Creatinine, Ser: 0.72 mg/dL (ref 0.57–1.00)
Globulin, Total: 2.5 g/dL (ref 1.5–4.5)
Glucose: 79 mg/dL (ref 70–99)
Potassium: 4.2 mmol/L (ref 3.5–5.2)
Sodium: 138 mmol/L (ref 134–144)
Total Protein: 6.1 g/dL (ref 6.0–8.5)
eGFR: 120 mL/min/{1.73_m2} (ref >59)

## 2023-07-06 LAB — PROTEIN / CREATININE RATIO, URINE
Creatinine, Urine: 93.9 mg/dL
Protein, Ur: 9 mg/dL
Protein/Creat Ratio: 96 mg/g{creat} (ref 0–200)

## 2023-07-08 LAB — AFP, SERUM, OPEN SPINA BIFIDA
AFP MoM: 1.75
AFP Value: 41.9 ng/mL
Gest. Age on Collection Date: 16.4 wk
Maternal Age At EDD: 24.1 a
OSBR Risk 1 IN: 2883
Test Results:: NEGATIVE
Weight: 389 [lb_av]

## 2023-07-15 NOTE — BH Specialist Note (Deleted)
Integrated Behavioral Health via Telemedicine Visit  07/15/2023 Bethany Pierce [id number]  Number of Integrated Behavioral Health Clinician visits: 1- Initial Visit  Session Start time: 1015   Session End time: 1101  Total time in minutes: 46   Referring Provider: Catalina Antigua, MD Patient/Family location: Home*** [facility] Provider location: [facility]  All persons participating in visit: Patient Bethany Pierce and [facility] Bethany Pierce ***  Types of Service: {CHL AMB TYPE OF SERVICE:[id number]}  I connected with Bethany Pierce and/or Bethany Pierce's {family members:20773} via  Telephone or Video Enabled Telemedicine Application  (Video is Caregility application) and verified that I am speaking with the correct person using two identifiers. Discussed confidentiality: Yes   I discussed the limitations of telemedicine and the availability of in person appointments.  Discussed there is a possibility of technology failure and discussed alternative modes of communication if that failure occurs.  I discussed that engaging in this telemedicine visit, they consent to the provision of behavioral healthcare and the services will be billed under their insurance.  Patient and/or legal guardian expressed understanding and consented to Telemedicine visit: Yes   Presenting Concerns: Patient and/or family reports the following symptoms/concerns: *** Duration of problem: ***; Severity of problem: {Mild/Moderate/Severe:20260}  Patient and/or Family's Strengths/Protective Factors: {CHL AMB BH PROTECTIVE [phone]}  Goals Addressed: Patient will:  Reduce symptoms of: {IBH Symptoms:21014056}   Increase knowledge and/or ability of: {IBH Patient Tools:21014057}   Demonstrate ability to: {IBH Goals:21014053}  Progress towards Goals: {CHL AMB BH PROGRESS TOWARDS GOALS:[phone]}  Interventions: Interventions utilized:  {IBH  Interventions:21014054} Standardized Assessments completed: {IBH Screening Tools:21014051}  Patient and/or Family Response: Patient agrees with treatment plan.   Assessment: Patient currently experiencing ***.   Patient may benefit from psychoeducation and brief therapeutic interventions regarding coping with symptoms of *** .  Plan: Follow up with behavioral health clinician on : *** Behavioral recommendations:  -*** -*** Referral(s): {IBH Referrals:21014055}  I discussed the assessment and treatment plan with the patient and/or parent/guardian. They were provided an opportunity to ask questions and all were answered. They agreed with the plan and demonstrated an understanding of the instructions.   They were advised to call back or seek an in-person evaluation if the symptoms worsen or if the condition fails to improve as anticipated.  Rae Lips, LCSW     05/30/2023    8:46 AM  Depression screen PHQ 2/9  Decreased Interest 0  Down, Depressed, Hopeless 1  PHQ - 2 Score 1  Altered sleeping 0  Tired, decreased energy 0  Change in appetite 0  Feeling bad or failure about yourself  0  Trouble concentrating 0  Moving slowly or fidgety/restless 0  Suicidal thoughts 0  PHQ-9 Score 1      05/30/2023    8:47 AM  GAD 7 : Generalized Anxiety Score  Nervous, Anxious, on Edge 1  Control/stop worrying 3  Worry too much - different things 0  Trouble relaxing 1  Restless 2  Easily annoyed or irritable 1  Afraid - awful might happen 3  Total GAD 7 Score 11

## 2023-07-22 ENCOUNTER — Other Ambulatory Visit (HOSPITAL_BASED_OUTPATIENT_CLINIC_OR_DEPARTMENT_OTHER): Payer: Self-pay

## 2023-07-22 ENCOUNTER — Ambulatory Visit: Payer: Medicaid Other | Admitting: Cardiology

## 2023-07-22 ENCOUNTER — Telehealth: Payer: Self-pay | Admitting: Pharmacist

## 2023-07-22 ENCOUNTER — Encounter: Payer: Self-pay | Admitting: Cardiology

## 2023-07-22 VITALS — BP 142/78 | Ht 66.0 in | Wt 392.8 lb

## 2023-07-22 DIAGNOSIS — Z7689 Persons encountering health services in other specified circumstances: Secondary | ICD-10-CM | POA: Diagnosis not present

## 2023-07-22 DIAGNOSIS — O0990 Supervision of high risk pregnancy, unspecified, unspecified trimester: Secondary | ICD-10-CM

## 2023-07-22 DIAGNOSIS — R0609 Other forms of dyspnea: Secondary | ICD-10-CM

## 2023-07-22 DIAGNOSIS — I10 Essential (primary) hypertension: Secondary | ICD-10-CM | POA: Diagnosis not present

## 2023-07-22 MED ORDER — NIFEDIPINE ER OSMOTIC RELEASE 30 MG PO TB24: 30.0000 mg | ORAL_TABLET | Freq: Every day | ORAL | 3 refills | Status: DC

## 2023-07-22 MED ORDER — OMRON 3 SERIES BP MONITOR DEVI
0 refills | Status: AC
  Filled 2023-07-22: qty 1, 1d supply, fill #0

## 2023-07-22 NOTE — Patient Instructions (Addendum)
Medication Instructions:  Your physician has recommended you make the following change in your medication:  START: Nifedipine 30 mg once daily  *If you need a refill on your cardiac medications before your next appointment, please call your pharmacy*   Testing/Procedures: Your physician has requested that you have an echocardiogram - OB. Echocardiography is a painless test that uses sound waves to create images of your heart. It provides your doctor with information about the size and shape of your heart and how well your heart's chambers and valves are working. This procedure takes approximately one hour. There are no restrictions for this procedure. Please do NOT wear cologne, perfume, aftershave, or lotions (deodorant is allowed). Please arrive 15 minutes prior to your appointment time.  Please note: We ask at that you not bring children with you during ultrasound (echo/ vascular) testing. Due to room size and safety concerns, children are not allowed in the ultrasound rooms during exams. Our front office staff cannot provide observation of children in our lobby area while testing is being conducted. An adult accompanying a patient to their appointment will only be allowed in the ultrasound room at the discretion of the ultrasound technician under special circumstances. We apologize for any inconvenience.    Follow-Up: At [facility], you and your health needs are our priority.  As part of our continuing mission to provide you with exceptional heart care, we have created designated Provider Care Teams.  These Care Teams include your primary Cardiologist (physician) and Advanced Practice Providers (APPs -  Physician Assistants and Nurse Practitioners) who all work together to provide you with the care you need, when you need it.   Your next appointment:   8 week(s)  Provider:   Thomasene Ripple, DO [address], [place], [place] [address]  Other Instructions Please keep your  appointment with [facility] (Pharmacy).

## 2023-07-22 NOTE — Progress Notes (Unsigned)
Cardio-Obstetrics Clinic  New Evaluation  Date:  07/22/2023   ID:  Bethany Pierce, DOB 02-25-2001, MRN [id number]  PCP:  Center, [facility] Providers Cardiologist:  Thomasene Ripple, DO  Electrophysiologist:  None   { Click to update primary MD,subspecialty MD or APP then REFRESH:1}    Referring MD: Hurshel Party,*   Chief Complaint: " I am short of breath time"  History of Present Illness:    Bethany Pierce is a 22 y.o. female [G1P0000] who is being seen today for the evaluation of S at the request of Leftwich-Kirby, Wilmer Floor,*.   Medical hx includes obesity.   She reports worsening shortness of breath.   Prior CV Studies Reviewed: The following studies were reviewed today: None   Past Medical History:  Diagnosis Date   Medical history non-contributory     Past Surgical History:  Procedure Laterality Date   NO PAST SURGERIES     { Click here to update PMH, PSH, OB Hx then refresh note  :1}   OB History     Gravida  1   Para  0   Term  0   Preterm  0   AB  0   Living  0      SAB  0   IAB  0   Ectopic  0   Multiple  0   Live Births  0           { Click here to update OB Charting then refresh note  :1}    Current Medications: Current Meds  Medication Sig   acetaminophen (TYLENOL) 500 MG tablet Take 500 mg by mouth every 6 (six) hours as needed for moderate pain.   aspirin EC 81 MG tablet Take 1 tablet (81 mg total) by mouth daily. Swallow whole.   Blood Pressure Monitoring (BLOOD PRESSURE KIT) DEVI 1 Device by Does not apply route once a week.   NIFEdipine (PROCARDIA-XL/NIFEDICAL-XL) 30 MG 24 hr tablet Take 1 tablet (30 mg total) by mouth daily.   Prenatal Vit-Fe Fumarate-FA (PRENATAL VITAMIN PO) Take 1 tablet by mouth daily.     Allergies:   Patient has no known allergies.   Social History   Socioeconomic History   Marital status: Single    Spouse name: Not on file   Number of children: Not on  file   Years of education: Not on file   Highest education level: Not on file  Occupational History   Not on file  Tobacco Use   Smoking status: Never    Passive exposure: Past   Smokeless tobacco: Never  Vaping Use   Vaping status: Never Used  Substance and Sexual Activity   Alcohol use: Not Currently    Comment: occ   Drug use: Never   Sexual activity: Yes    Partners: Male    Birth control/protection: Condom    Comment: menarche 22yo, sexual debut 22yo  Other Topics Concern   Not on file  Social History Narrative   Not on file   Social Drivers of Health   Financial Resource Strain: Not on file  Food Insecurity: Not on file  Transportation Needs: Not on file  Physical Activity: Not on file  Stress: Not on file  Social Connections: Not on file  { Click here to update SDOH then refresh :1}    Family History  Problem Relation Age of Onset   Diabetes Mother    Hypertension Father  Diabetes Maternal Grandmother    Hypertension Paternal Grandmother    { Click here to update FH then refresh note    :1}   ROS:   Please see the history of present illness.    Shortness of breath All other systems reviewed and are negative.   Labs/EKG Reviewed:    EKG:   EKG was ordered today.  The ekg ordered today demonstrates sinus rhythm  Recent Labs: 05/30/2023: Hemoglobin 12.1; Platelets 309 07/04/2023: ALT 23; BUN 7; Creatinine, Ser 0.72; Potassium 4.2; Sodium 138   Recent Lipid Panel No results found for: "CHOL", "TRIG", "HDL", "CHOLHDL", "LDLCALC", "LDLDIRECT"  Physical Exam:    VS:  BP (!) 142/78 (BP Location: Left Arm)   Ht 5\' 6"  (1.676 m)   Wt (!) 392 lb 12.8 oz (178.2 kg)   LMP 03/11/2023   SpO2 99%   BMI 63.40 kg/m     Wt Readings from Last 3 Encounters:  07/22/23 (!) 392 lb 12.8 oz (178.2 kg)  07/04/23 (!) 389 lb (176.4 kg)  06/06/23 (!) 382 lb (173.3 kg)     GEN:  Well nourished, well developed in no acute distress HEENT: Normal NECK: No JVD;  No carotid bruits LYMPHATICS: No lymphadenopathy CARDIAC: RRR, no murmurs, rubs, gallops RESPIRATORY:  Clear to auscultation without rales, wheezing or rhonchi  ABDOMEN: Soft, non-tender, non-distended MUSCULOSKELETAL:  No edema; No deformity  SKIN: Warm and dry NEUROLOGIC:  Alert and oriented x 3 PSYCHIATRIC:  Normal affect    Risk Assessment/Risk Calculators:   { Click to calculate CARPREG II - THEN refresh note :1}  CARPREG II Risk Prediction Index Score:  20.  The patient's risk for a primary cardiac event is 41%. { Click to caclulate Mod WHO Class of CV Risk - THEN refresh note :1}     { Click for CHADS2VASc Score - THEN Refresh Note    :[id number]}      ASSESSMENT & PLAN:    Shortness of breath Hypertension in pregnancy   Blood pressure elevated in the office, manually performed by me.  With this prior to 20 weeks, suspect chronic hypertension.  Will start Nifedipine 30 mg daily  She will see our cardio-OB pharmacist.  Dyspnea on exertion - needs echo   Follow up in  8 weeks    Patient Instructions  Medication Instructions:  Your physician has recommended you make the following change in your medication:  START: Nifedipine 30 mg once daily  *If you need a refill on your cardiac medications before your next appointment, please call your pharmacy*   Testing/Procedures: Your physician has requested that you have an echocardiogram - OB. Echocardiography is a painless test that uses sound waves to create images of your heart. It provides your doctor with information about the size and shape of your heart and how well your heart's chambers and valves are working. This procedure takes approximately one hour. There are no restrictions for this procedure. Please do NOT wear cologne, perfume, aftershave, or lotions (deodorant is allowed). Please arrive 15 minutes prior to your appointment time.  Please note: We ask at that you not bring children with you during ultrasound  (echo/ vascular) testing. Due to room size and safety concerns, children are not allowed in the ultrasound rooms during exams. Our front office staff cannot provide observation of children in our lobby area while testing is being conducted. An adult accompanying a patient to their appointment will only be allowed in the ultrasound room at the discretion of  the ultrasound technician under special circumstances. We apologize for any inconvenience.    Follow-Up: At [facility], you and your health needs are our priority.  As part of our continuing mission to provide you with exceptional heart care, we have created designated Provider Care Teams.  These Care Teams include your primary Cardiologist (physician) and Advanced Practice Providers (APPs -  Physician Assistants and Nurse Practitioners) who all work together to provide you with the care you need, when you need it.   Your next appointment:   8 week(s)  Provider:   Thomasene Ripple, DO     Other Instructions Please keep your appointment with [facility] (Pharmacy).     Dispo:  No follow-ups on file.   Medication Adjustments/Labs and Tests Ordered: Current medicines are reviewed at length with the patient today.  Concerns regarding medicines are outlined above.  Tests Ordered: Orders Placed This Encounter  Procedures   AMB Referral to [facility] Pharm-D   EKG 12-Lead   ECHOCARDIOGRAM COMPLETE   Medication Changes: Meds ordered this encounter  Medications   NIFEdipine (PROCARDIA-XL/NIFEDICAL-XL) 30 MG 24 hr tablet    Sig: Take 1 tablet (30 mg total) by mouth daily.    Dispense:  90 tablet    Refill:  3

## 2023-07-22 NOTE — Addendum Note (Signed)
Addended by: Cheree Ditto on: 07/22/2023 03:12 PM   Modules accepted: Orders

## 2023-07-22 NOTE — Telephone Encounter (Signed)
In room consult after appointment with Dr Servando Salina. Patient has not been able to check BP at home due to pharmacy not bring able to order large cuff. Will check with [facility] pharmacies to see if it can be ordered. Advised on possible a/e of nifedipine. Follow up in 1 month.

## 2023-07-22 NOTE — Telephone Encounter (Signed)
[facility] has XL cuffs in stock. Contacted patient and she will pick up tomorrow.

## 2023-08-01 ENCOUNTER — Encounter: Payer: Self-pay | Admitting: Obstetrics and Gynecology

## 2023-08-01 ENCOUNTER — Ambulatory Visit (INDEPENDENT_AMBULATORY_CARE_PROVIDER_SITE_OTHER): Payer: Medicaid Other | Admitting: Obstetrics and Gynecology

## 2023-08-01 VITALS — BP 133/89 | HR 91 | Wt 394.9 lb

## 2023-08-01 DIAGNOSIS — Z3A20 20 weeks gestation of pregnancy: Secondary | ICD-10-CM

## 2023-08-01 DIAGNOSIS — O10919 Unspecified pre-existing hypertension complicating pregnancy, unspecified trimester: Secondary | ICD-10-CM

## 2023-08-01 DIAGNOSIS — O0990 Supervision of high risk pregnancy, unspecified, unspecified trimester: Secondary | ICD-10-CM

## 2023-08-01 DIAGNOSIS — O99210 Obesity complicating pregnancy, unspecified trimester: Secondary | ICD-10-CM

## 2023-08-01 DIAGNOSIS — A6000 Herpesviral infection of urogenital system, unspecified: Secondary | ICD-10-CM

## 2023-08-01 NOTE — Progress Notes (Addendum)
   PRENATAL VISIT NOTE  Subjective:  Bethany Pierce is a 22 y.o. G1P0000 at [redacted]w[redacted]d being seen today for ongoing prenatal care.  She is currently monitored for the following issues for this high-risk pregnancy and has Supervision of high risk pregnancy, antepartum; Obesity affecting pregnancy, antepartum; and BMI 60.0-69.9, adult (HCC) on their problem list.  Patient reports no complaints.  Contractions: Not present. Vag. Bleeding: None.  Movement: Absent. Denies leaking of fluid.   The following portions of the patient's history were reviewed and updated as appropriate: allergies, current medications, past family history, past medical history, past social history, past surgical history and problem list.   Objective:   Vitals:   08/01/23 0852  BP: 133/89  Pulse: 91  Weight: (!) 394 lb 14.4 oz (179.1 kg)    Fetal Status: Fetal Heart Rate (bpm): 150   Movement: Absent     General:  Alert, oriented and cooperative. Patient is in no acute distress.  Skin: Skin is warm and dry. No rash noted.   Cardiovascular: Normal heart rate noted  Respiratory: Normal respiratory effort, no problems with respiration noted  Abdomen: Soft, gravid, appropriate for gestational age.  Pain/Pressure: Absent     Pelvic: Cervical exam deferred        Extremities: Normal range of motion.  Edema: None  Mental Status: Normal mood and affect. Normal behavior. Normal judgment and thought content.   Assessment and Plan:  Pregnancy: G1P0000 at [redacted]w[redacted]d 1. Supervision of high risk pregnancy, antepartum (Primary) BP and FHR normal Doing well   2. Chronic hypertension affecting pregnancy Seen by [facility] cardiology, elevated BP in her office, suspect CHTN, started Nifedipine 30 once daily Discussed what to monitor for at home  and when to follow up or go to hospital   3. Obesity affecting pregnancy, antepartum, unspecified obesity type U/s scheduled 12/31  4. [redacted] weeks gestation of pregnancy Anticipatory guidance  regarding upcoming appts  5. Recurrent genital HSV (herpes simplex virus) infection Suppression 34-36 weeks  Preterm labor symptoms and general obstetric precautions including but not limited to vaginal bleeding, contractions, leaking of fluid and fetal movement were reviewed in detail with the patient. Please refer to After Visit Summary for other counseling recommendations.   Return in about 4 weeks (around 08/29/2023) for OB VISIT (MD or APP).  Future Appointments  Date Time Provider Department Center  08/09/2023  8:15 AM [facility] NURSE [facility]  08/09/2023  8:30 AM [facility] US1 [facility]  08/19/2023  2:00 PM Cheree Ditto, [facility] None  08/30/2023  8:35 AM MC-CV [facility] ECHO 5 [facility]SITE[facility]CHO [facility]  09/09/2023  2:00 PM Tobb, Kardie, DO [facility] None  09/15/2023  8:30 AM [facility] PHARMACIST [facility] None    Albertine Grates, FNP

## 2023-08-01 NOTE — Progress Notes (Signed)
Pt presents for ROB. Complaints of increased vaginal discharge.

## 2023-08-09 ENCOUNTER — Ambulatory Visit: Payer: Medicaid Other | Attending: Obstetrics and Gynecology

## 2023-08-09 ENCOUNTER — Ambulatory Visit: Payer: Medicaid Other | Admitting: *Deleted

## 2023-08-09 ENCOUNTER — Ambulatory Visit (HOSPITAL_BASED_OUTPATIENT_CLINIC_OR_DEPARTMENT_OTHER): Payer: Medicaid Other | Admitting: Maternal & Fetal Medicine

## 2023-08-09 ENCOUNTER — Other Ambulatory Visit: Payer: Self-pay | Admitting: *Deleted

## 2023-08-09 VITALS — BP 130/83 | HR 82

## 2023-08-09 DIAGNOSIS — R9389 Abnormal findings on diagnostic imaging of other specified body structures: Secondary | ICD-10-CM | POA: Insufficient documentation

## 2023-08-09 DIAGNOSIS — O99210 Obesity complicating pregnancy, unspecified trimester: Secondary | ICD-10-CM | POA: Diagnosis not present

## 2023-08-09 DIAGNOSIS — O09292 Supervision of pregnancy with other poor reproductive or obstetric history, second trimester: Secondary | ICD-10-CM | POA: Diagnosis not present

## 2023-08-09 DIAGNOSIS — Z362 Encounter for other antenatal screening follow-up: Secondary | ICD-10-CM

## 2023-08-09 DIAGNOSIS — Z6841 Body Mass Index (BMI) 40.0 and over, adult: Secondary | ICD-10-CM

## 2023-08-09 DIAGNOSIS — O0990 Supervision of high risk pregnancy, unspecified, unspecified trimester: Secondary | ICD-10-CM | POA: Diagnosis not present

## 2023-08-09 DIAGNOSIS — O99212 Obesity complicating pregnancy, second trimester: Secondary | ICD-10-CM | POA: Diagnosis not present

## 2023-08-09 DIAGNOSIS — O10912 Unspecified pre-existing hypertension complicating pregnancy, second trimester: Secondary | ICD-10-CM | POA: Diagnosis not present

## 2023-08-09 DIAGNOSIS — Z363 Encounter for antenatal screening for malformations: Secondary | ICD-10-CM | POA: Insufficient documentation

## 2023-08-09 DIAGNOSIS — Z3480 Encounter for supervision of other normal pregnancy, unspecified trimester: Secondary | ICD-10-CM | POA: Diagnosis not present

## 2023-08-09 DIAGNOSIS — O10919 Unspecified pre-existing hypertension complicating pregnancy, unspecified trimester: Secondary | ICD-10-CM

## 2023-08-09 DIAGNOSIS — Z3A21 21 weeks gestation of pregnancy: Secondary | ICD-10-CM | POA: Insufficient documentation

## 2023-08-09 NOTE — Progress Notes (Signed)
 Patient information  Patient Name: Bethany Pierce  Patient MRN:   [id number]  Referring practice: MFM Referring Provider: [facility])  MFM CONSULT  Bethany Pierce is a 23 y.o. G1P0000 at [redacted]w[redacted]d here for ultrasound and consultation. Patient Active Problem List   Diagnosis Date Noted  . Abnormal ultrasound (possible fetal renal anomaly and lymphangioma of the neck) 08/09/2023  . Chronic hypertension affecting pregnancy 08/01/2023  . Obesity affecting pregnancy, antepartum 06/06/2023  . BMI 60.0-69.9, adult (HCC) 06/06/2023  . Supervision of high risk pregnancy, antepartum 05/30/2023   RE chronic hypertension: I discussed the chronic hypertension can affect pregnancy.  I discussed the risk of preeclampsia and the need for low-dose aspirin  which she is compliant with.  Chronic hypertension in pregnancy can affect fetal growth and serial growth ultrasounds are recommended.  There is also a slight increased risk of stillbirth near the end of the pregnancy especially if blood pressure is difficult to control.  Antihypertensives are recommended for blood pressure greater than 140/90.  Overall I reassured the patient that high blood pressure during pregnancy can be managed well with good outcomes.  RE obesity in pregnancy:  I discussed the potential complications associated with obesity in pregnancy.  These complications include but are not limited to increased risk of excessive maternal weight gain, fetal growth abnormalities, fetal congenital disorders, inability to visualize fetal anatomic structures on ultrasound, gestational diabetes, hypertensive disorders of pregnancy, operative birth including cesarean delivery or assisted vaginal delivery, delayed wound healing and many long-term health complications.  I discussed the need for continued growth ultrasounds and possibly antenatal testing depending upon how the pregnancy course progresses.  Maternal weight gain should be  limited to 10 to 20 pounds during the pregnancy.  While normal weight loss may occur during the first and early second trimester, efforts to actively lose weight with the use of medication is not recommended during pregnancy.  A whole food diet and regular exercise of at least 15 to 30 minutes of moderately strenuous activity is recommended in the absence of any contraindications. Weight loss with the use of medications is not recommended during pregnancy.    RE potential fetal anomaly: There is concern that there may be a lymphangioma on the posterior aspect of the fetal neck.  It is difficult to visualize due to maternal body habitus and fetal position.  I discussed the potential concern for this fetal abnormality and the possible need for surgery after birth.  There is also concern for echogenic kidneys on today's ultrasound.  The amniotic fluid is normal which suggest normal kidney function for at least 1 kidney.  I discussed this could represent an early kidney abnormality which should be reassessed in the future.  I discussed that the differential diagnosis would include microcystic or polycystic kidney disease or genetic syndrome.  I discussed the role of amniocentesis as well as the potential risks and benefits.  Patient verbalized understanding and would like to come back in 2 weeks for an ultrasound and she will consider amniocentesis if the concern for a fetal abnormality is confirmed on this subsequent ultrasound.  Sonographic findings Single intrauterine pregnancy at 21w 0d  Fetal cardiac activity:  Observed and appears normal. Presentation: Cephalic. There is concern for a possible lymphangioma on the fetal neck on today's ultrasound.  The posterior aspect of the skin line on the cervical spinal column appears to have irregular contour.  There is no blood flow to this area.  The  thickened area spans from the occiput to the fetal shoulder.  I discussed this could represent simply the fetal  shoulder and arm obscuring the image off of a true midline sagittal plane.  I also discussed this could be a lymphangioma or resolving his cystic hygroma.  The fetal kidneys also appear mildly echogenic on today's ultrasound.  However, the kidney length appears normal and this may represent a normal variation in kidney appearance.  The amniotic fluid is normal which suggests normal kidney function. Fetal biometry shows the estimated fetal weight at the 68 percentile.  Amniotic fluid: Within normal limits.  MVP: 3.62 cm. Placenta: Posterior. Adnexa: No abnormality visualized. Cervical length: not well visualized.   Recommendations -EDD is Estimated Date of Delivery: 12/16/23 based on U/S C R L  (05/30/23). -Detailed ultrasound was done today with the above concerns.  -Baseline preeclampsia labs: CMP, CBC and urine protein/creatinine ratio if not previously completed.  -Early glucose screening due to multiple risk factors. -Continue Aspirin  81 mg for preeclampsia prophylasis -Follow-up anatomy and fetal growth in 4 to 6 weeks -Serial growth ultrasounds starting around 28 weeks to monitor for fetal growth restriction -Fetal echo should be ordered if there continues to be concern on future ultrasounds.  -Antenatal testing to start around 32 weeks due to the increased risk of stillbirth and high risk pregnancy -Delivery timing pending clinical  -Continue routine prenatal care with referring OB provider  Review of Systems: A review of systems was performed and was negative except per HPI   Vitals and Physical Exam    08/09/2023    8:08 AM 08/01/2023    8:52 AM 07/22/2023    2:05 PM  Vitals with BMI  Weight  394 lbs 14 oz   BMI  63.77   Systolic 130 133 857  Diastolic 83 89 78  Pulse 82 91    Sitting comfortably on the sonogram table Nonlabored breathing Normal rate and rhythm Abdomen is nontender  Past pregnancies OB History  Gravida Para Term Preterm AB Living  1 0 0 0 0 0  SAB  IAB Ectopic Multiple Live Births  0 0 0 0 0    # Outcome Date GA Lbr Len/2nd Weight Sex Type Anes PTL Lv  1 Current             I spent 60 minutes reviewing the patients chart, including labs and images as well as counseling the patient about her medical conditions. Greater than 50% of the time was spent in direct face-to-face patient counseling.  Delora Smaller  MFM, [facility]   08/09/2023  10:46 AM

## 2023-08-11 ENCOUNTER — Telehealth: Payer: Medicaid Other

## 2023-08-11 ENCOUNTER — Telehealth: Payer: Self-pay | Admitting: *Deleted

## 2023-08-11 ENCOUNTER — Telehealth: Payer: Medicaid Other | Admitting: Family Medicine

## 2023-08-11 DIAGNOSIS — N898 Other specified noninflammatory disorders of vagina: Secondary | ICD-10-CM

## 2023-08-11 DIAGNOSIS — Z3A21 21 weeks gestation of pregnancy: Secondary | ICD-10-CM

## 2023-08-11 NOTE — Telephone Encounter (Signed)
-----   Message from [facility] sent at 08/11/2023  8:23 AM EST ----- Please have patient come in prior to her 08/29/23 appointment for 2 hour glucola per MFM  Thanks  Gigi Gin

## 2023-08-11 NOTE — Progress Notes (Signed)
 [place]   [redacted] weeks pregnant, needs swab to get best and most safest treatment option. Needs to be seen in person.  Patient acknowledged agreement and understanding of the plan.

## 2023-08-11 NOTE — Telephone Encounter (Signed)
 Spoke with pt making her aware of need for early GTT.  Pt has been scheduled for 08/15/23, pt made aware to arrive fasting.

## 2023-08-15 ENCOUNTER — Other Ambulatory Visit: Payer: Medicaid Other

## 2023-08-17 ENCOUNTER — Other Ambulatory Visit: Payer: Medicaid Other

## 2023-08-17 DIAGNOSIS — O0990 Supervision of high risk pregnancy, unspecified, unspecified trimester: Secondary | ICD-10-CM

## 2023-08-17 DIAGNOSIS — O99210 Obesity complicating pregnancy, unspecified trimester: Secondary | ICD-10-CM

## 2023-08-17 DIAGNOSIS — Z6841 Body Mass Index (BMI) 40.0 and over, adult: Secondary | ICD-10-CM

## 2023-08-17 NOTE — Progress Notes (Unsigned)
 Orders placed for GTT per Dr. Deretha Emory recommendation for early GTT.

## 2023-08-18 LAB — GLUCOSE TOLERANCE, 2 HOURS W/ 1HR
Glucose, 1 hour: 80 mg/dL (ref 70–179)
Glucose, 2 hour: 77 mg/dL (ref 70–152)
Glucose, Fasting: 64 mg/dL — ABNORMAL LOW (ref 70–91)

## 2023-08-19 ENCOUNTER — Encounter: Payer: Self-pay | Admitting: Pharmacist

## 2023-08-19 ENCOUNTER — Ambulatory Visit (INDEPENDENT_AMBULATORY_CARE_PROVIDER_SITE_OTHER): Payer: Medicaid Other | Admitting: Pharmacist

## 2023-08-19 VITALS — BP 123/85 | HR 88 | Wt 394.4 lb

## 2023-08-19 DIAGNOSIS — Z3A23 23 weeks gestation of pregnancy: Secondary | ICD-10-CM | POA: Diagnosis not present

## 2023-08-19 DIAGNOSIS — O10919 Unspecified pre-existing hypertension complicating pregnancy, unspecified trimester: Secondary | ICD-10-CM

## 2023-08-19 NOTE — Progress Notes (Signed)
 Patient ID: Bethany Pierce                 DOB: May 23, 2001                      MRN: [id number]     HPI: Bethany Pierce is a 23 y.o. female presenting to Cardio OB clinic for HTN follow up. Patient's first pregancy. Currently 23w 0d, EDD 12/16/23.  Patient presents today in good spirits. 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 Reports high stress job. Believes her BP increases at work, especially with physical activity. Had to change a patient this morning and felt pain in lower right abdomen.   Has been checking BP at home but forgot to bring cuff/log. Reports it has been trending downwards. Believes it was 113/83 this morning.  Currently on nifedipine  30mg  once daily. Reports no adverse effects  Current HTN meds:  Nifedipine  30mg     Wt Readings from Last 3 Encounters:  08/01/23 (!) 394 lb 14.4 oz (179.1 kg)  07/22/23 (!) 392 lb 12.8 oz (178.2 kg)  07/04/23 (!) 389 lb (176.4 kg)   BP Readings from Last 3 Encounters:  08/09/23 130/83  08/01/23 133/89  07/22/23 (!) 142/78   Pulse Readings from Last 3 Encounters:  08/09/23 82  08/01/23 91  07/04/23 85    Renal function: CrCl cannot be calculated (Patient's most recent lab result is older than the maximum 21 days allowed.).  Past Medical History:  Diagnosis Date   Hypertension    Medical history non-contributory     Current Outpatient Medications on File Prior to Visit  Medication Sig Dispense Refill   acetaminophen  (TYLENOL ) 500 MG tablet Take 500 mg by mouth every 6 (six) hours as needed for moderate pain.     aspirin  EC 81 MG tablet Take 1 tablet (81 mg total) by mouth daily. Swallow whole. 90 tablet 2   Blood Pressure Monitoring (OMRON 3 SERIES BP MONITOR) DEVI Use to check blood pressure daily 1 each 0   NIFEdipine  (PROCARDIA -XL/NIFEDICAL-XL) 30 MG 24 hr tablet Take 1 tablet (30 mg total) by mouth daily. 90 tablet 3   Prenatal Vit-Fe Fumarate-FA (PRENATAL VITAMIN PO) Take 1 tablet by mouth daily.     No current  facility-administered medications on file prior to visit.    No Known Allergies   Assessment/Plan:  1. Hypertension -  Patient BP today well controlled at 123/85 and patient feels well. No medication changes needed at this time. Advised to continue to monitor at home and contact clinic with any concerns. Follow up with Dr Bethany Pierce in 2 weeks.  Continue nifedipine  30mg  daily Recheck with Dr Bethany Pierce in 2 weeks  Medford Bolk, PharmD, BCACP, CDCES, CPP [address] [place], [place], [address] Phone: [phone], Fax: [phone]

## 2023-08-19 NOTE — Patient Instructions (Signed)
 Your blood pressure looks great today!  Continue your nifedipine  30mg  once a day  Continue monitoring your blood pressure at home. Bring your home log with you to your next appointment.  Try to watch your diet. Try to limit the amount of salt you are eating  Let us  know if you have any questions  Medford Bolk, PharmD, BCACP, CDCES, CPP [address] [place], [place], [address] Phone: [phone], Fax: [phone]

## 2023-08-22 ENCOUNTER — Encounter: Payer: Self-pay | Admitting: Advanced Practice Midwife

## 2023-08-22 ENCOUNTER — Ambulatory Visit (INDEPENDENT_AMBULATORY_CARE_PROVIDER_SITE_OTHER): Payer: Medicaid Other | Admitting: Advanced Practice Midwife

## 2023-08-22 VITALS — BP 143/86 | HR 99 | Wt 398.8 lb

## 2023-08-22 DIAGNOSIS — O10012 Pre-existing essential hypertension complicating pregnancy, second trimester: Secondary | ICD-10-CM

## 2023-08-22 DIAGNOSIS — O10919 Unspecified pre-existing hypertension complicating pregnancy, unspecified trimester: Secondary | ICD-10-CM

## 2023-08-22 DIAGNOSIS — R102 Pelvic and perineal pain unspecified side: Secondary | ICD-10-CM

## 2023-08-22 DIAGNOSIS — O26892 Other specified pregnancy related conditions, second trimester: Secondary | ICD-10-CM

## 2023-08-22 DIAGNOSIS — Z3A23 23 weeks gestation of pregnancy: Secondary | ICD-10-CM

## 2023-08-22 DIAGNOSIS — O0990 Supervision of high risk pregnancy, unspecified, unspecified trimester: Secondary | ICD-10-CM

## 2023-08-22 DIAGNOSIS — O26899 Other specified pregnancy related conditions, unspecified trimester: Secondary | ICD-10-CM

## 2023-08-22 DIAGNOSIS — O26852 Spotting complicating pregnancy, second trimester: Secondary | ICD-10-CM

## 2023-08-22 MED ORDER — NIFEDIPINE ER OSMOTIC RELEASE 30 MG PO TB24: 30.0000 mg | ORAL_TABLET | Freq: Two times a day (BID) | ORAL | 3 refills | Status: DC

## 2023-08-22 NOTE — Progress Notes (Signed)
 Pt presents for AEX. Pt reports spotting 3 days ago. No concerns

## 2023-08-22 NOTE — Progress Notes (Signed)
 PRENATAL VISIT NOTE  Subjective:  Bethany Pierce is a 23 y.o. G1P0000 at [redacted]w[redacted]d being seen today for ongoing prenatal care.  She is currently monitored for the following issues for this high-risk pregnancy and has Supervision of high risk pregnancy, antepartum; Obesity affecting pregnancy, antepartum; BMI 60.0-69.9, adult (HCC); Chronic hypertension affecting pregnancy; and Abnormal ultrasound (possible fetal renal anomaly and lymphangioma of the neck) on their problem list.  Patient reports  intermittent sharp lower abdominal pain, first occurring at work, and spotting 3 days ago that resolved .  Contractions: Irritability. Vag. Bleeding: None.  Movement: Present. Denies leaking of fluid.   The following portions of the patient's history were reviewed and updated as appropriate: allergies, current medications, past family history, past medical history, past social history, past surgical history and problem list.   Objective:   Vitals:   08/22/23 1414 08/22/23 1420  BP: (!) 141/80 (!) 143/86  Pulse: (!) 101 99  Weight: (!) 398 lb 12.8 oz (180.9 kg)     Fetal Status: Fetal Heart Rate (bpm): 147   Movement: Present     General:  Alert, oriented and cooperative. Patient is in no acute distress.  Skin: Skin is warm and dry. No rash noted.   Cardiovascular: Normal heart rate noted  Respiratory: Normal respiratory effort, no problems with respiration noted  Abdomen: Soft, gravid, appropriate for gestational age.  Pain/Pressure: Absent     Pelvic: Cervical exam deferred        Extremities: Normal range of motion.  Edema: Trace  Mental Status: Normal mood and affect. Normal behavior. Normal judgment and thought content.   Assessment and Plan:  Pregnancy: G1P0000 at [redacted]w[redacted]d 1. Supervision of high risk pregnancy, antepartum (Primary) --Anticipatory guidance about next visits/weeks of pregnancy given.   --Pt having difficulty at work, with recent injury lifting/pulling heavy patient, and her  managers not letting her leave work when she was bleeding to go to the hospital.  She applied to transfer to another unit with less lifting and was denied.  I have provided a letter for Bethany Pierce to begin her leave of absence now, but she will need to work with HR on how that will be paid and when she will need to return postpartum.     2. Chronic hypertension affecting pregnancy --BP elevated at home, at work, and today in the office. No severe range BPs, no s/sx of PEC --Increase Procardia  to 60 XL daily --Pt left without labs, so will repeat at next visit --PEC precautions given - NIFEdipine  (PROCARDIA -XL/NIFEDICAL-XL) 30 MG 24 hr tablet; Take 1 tablet (30 mg total) by mouth in the morning and at bedtime.  Dispense: 90 tablet; Refill: 3  3. Spotting affecting pregnancy in second trimester --Spotting 3 days ago at work, none today --Occurred after moving heavy patient at work, associated with cramping  4. Pain of round ligament affecting pregnancy, antepartum --Rest/ice/heat/warm bath/increase PO fluids/Tylenol /pregnancy support belt   --See above for work note  Preterm labor symptoms and general obstetric precautions including but not limited to vaginal bleeding, contractions, leaking of fluid and fetal movement were reviewed in detail with the patient. Please refer to After Visit Summary for other counseling recommendations.   No follow-ups on file.  Future Appointments  Date Time Provider Department Center  08/23/2023 10:30 AM [facility] US6 [facility]  08/29/2023  9:45 AM Sharpe, Cquadayshia L, LCSWA [facility] None  08/30/2023  8:35 AM MC-CV CH ECHO 5 [facility]SITE3ECHO [facility]  09/06/2023 10:30 AM [facility] US6 [facility]  [facility]  09/09/2023  2:00 PM Tobb, Kardie, DO [facility] None  09/15/2023  8:30 AM CVD-NLINE PHARMACIST [facility] None  09/26/2023  8:15 AM Starla Harland BROCKS, MD [facility] None  09/26/2023  8:45 AM [facility] LAB [facility] None    Olam Boards, CNM

## 2023-08-23 ENCOUNTER — Other Ambulatory Visit: Payer: Self-pay | Admitting: *Deleted

## 2023-08-23 ENCOUNTER — Other Ambulatory Visit: Payer: Self-pay

## 2023-08-23 ENCOUNTER — Ambulatory Visit: Payer: Medicaid Other

## 2023-08-23 ENCOUNTER — Ambulatory Visit: Payer: Medicaid Other | Attending: Maternal & Fetal Medicine

## 2023-08-23 ENCOUNTER — Ambulatory Visit: Payer: Medicaid Other | Attending: Obstetrics and Gynecology | Admitting: Obstetrics

## 2023-08-23 DIAGNOSIS — Z3A23 23 weeks gestation of pregnancy: Secondary | ICD-10-CM

## 2023-08-23 DIAGNOSIS — O35EXX0 Maternal care for other (suspected) fetal abnormality and damage, fetal genitourinary anomalies, not applicable or unspecified: Secondary | ICD-10-CM

## 2023-08-23 DIAGNOSIS — Z362 Encounter for other antenatal screening follow-up: Secondary | ICD-10-CM | POA: Diagnosis present

## 2023-08-23 DIAGNOSIS — O99212 Obesity complicating pregnancy, second trimester: Secondary | ICD-10-CM | POA: Insufficient documentation

## 2023-08-23 DIAGNOSIS — E669 Obesity, unspecified: Secondary | ICD-10-CM | POA: Diagnosis not present

## 2023-08-23 DIAGNOSIS — O358XX0 Maternal care for other (suspected) fetal abnormality and damage, not applicable or unspecified: Secondary | ICD-10-CM | POA: Diagnosis not present

## 2023-08-23 DIAGNOSIS — R93429 Abnormal radiologic findings on diagnostic imaging of unspecified kidney: Secondary | ICD-10-CM | POA: Diagnosis not present

## 2023-08-23 NOTE — Progress Notes (Signed)
 MFM Note  Bethany Pierce is currently at 23 weeks and 4 days.  She has been followed due to maternal obesity with a BMI of 62 and large echogenic kidneys and possible lymphangioma noted on her prior ultrasound exam.  She denies any problems since her last exam.  On today's exam, enlarged echogenic kidneys continue to be noted. Normal amniotic fluid along with a normal-appearing filled fetal bladder were noted on today's exam, indicating that the kidneys are producing normal amounts of urine.    The possibility of autosomal recessive (infantile) polycystic kidney disease as the cause of the echogenic kidneys was discussed.    The patient was advised that should the fetus have autosomal recessive infantile polycystic kidney disease, the clinical presentation after birth is highly variable and may include early onset hypertension, renal insufficiency, portal hypertension, and kidney failure requiring dialysis.    Her baby will require imaging studies of the kidneys after birth to determine if polycystic kidneys are present.  The baby should also be referred to a pediatric nephrologist for management after birth.  The patient was also advised that the polycystic kidneys may also be a normal variant as the amniotic fluid volume is within normal limits.  She understands that the amniotic fluid level may decrease later in her pregnancy.  We will continue to follow her to assess the size of the kidneys and the amniotic fluid levels during her future exams.  The possibility of a fetal chromosomal abnormality as the cause of the echogenic kidneys was discussed.  She was offered and declined an amniocentesis for definitive diagnosis.  She was reassured that as the amniotic fluid level is within normal limits today, her baby's risk of pulmonary hypoplasia (which is commonly associated with infantile polycystic kidney disease) is probably low.    Due to the echogenic kidneys noted today, the patient had blood  tests (IgM, IgG antibodies) to screen for an acute CMV and toxoplasmosis infection.  There were no signs of lymphangioma noted in the fetus today.    She will return in 2 weeks for another growth ultrasound.    The patient and her mother stated that all of their questions were answered today.  A total of 30 minutes was spent counseling and coordinating the care for this patient.  Greater than 50% of the time was spent in direct face-to-face contact.

## 2023-08-24 LAB — TOXOPLASMA GONDII ANTIBODY, IGM: Toxoplasma Antibody- IgM: <3 [AU]/ml (ref 0.0–7.9)

## 2023-08-24 LAB — TOXOPLASMA GONDII ANTIBODY, IGG: Toxoplasma IgG Ratio: <3 [IU]/mL (ref 0.0–7.1)

## 2023-08-24 LAB — CMV ANTIBODY, IGG (EIA): CMV Ab - IgG: <0.6 U/mL (ref 0.00–0.59)

## 2023-08-24 LAB — INFECT DISEASE AB IGM REFLEX 1

## 2023-08-24 LAB — CMV IGM: CMV IgM Ser EIA-aCnc: <30 [AU]/ml (ref 0.0–29.9)

## 2023-08-29 ENCOUNTER — Ambulatory Visit (INDEPENDENT_AMBULATORY_CARE_PROVIDER_SITE_OTHER): Payer: Medicaid Other | Admitting: Licensed Clinical Social Worker

## 2023-08-29 ENCOUNTER — Encounter: Payer: Medicaid Other | Admitting: Obstetrics and Gynecology

## 2023-08-29 DIAGNOSIS — F4323 Adjustment disorder with mixed anxiety and depressed mood: Secondary | ICD-10-CM

## 2023-08-29 NOTE — BH Specialist Note (Signed)
Integrated Behavioral Health Initial In-Person Visit  MRN: [id number] Name: Danay Mckellar  Number of Integrated Behavioral Health Clinician visits: 2- Second Visit  Session Start time: 0900    Session End time: 1000  Total time in minutes: 60   Types of Service: Individual psychotherapy  Interpretor:No. Interpretor Name and Language: none     Warm Hand Off Completed.        Subjective: Tenecia Ignasiak is a 23 y.o. female accompanied by  self Patient was referred by Dr. Jolayne Panther  for Anxiety symptoms. Patient reports the following symptoms/concerns: history of anxiety and depression Duration of problem: Months; Severity of problem: moderate  Objective: Mood: Euthymic and Affect: Appropriate Risk of harm to self or others: No plan to harm self or others  Life Context: Family and Social: Patient lives with mother.  School/Work: Patient does not attend school and she's currently unemployed.  Self-Care: Nails and feet done, shopping, and taking walks.  Life Changes: Currently pregnant due May/2025.   Patient and/or Family's Strengths/Protective Factors: Social and Emotional competence and Concrete supports in place (healthy food, safe environments, etc.)  Goals Addressed: Patient will: Reduce symptoms of: anxiety and depression Increase knowledge and/or ability of: coping skills and healthy habits  Demonstrate ability to: Increase healthy adjustment to current life circumstances and Increase adequate support systems for patient/family  Progress towards Goals: Discontinued  Interventions: Interventions utilized: Solution-Focused Strategies, Supportive Counseling, Psychoeducation and/or Health Education, and Supportive Reflection  Standardized Assessments completed: Not Needed  Patient and/or Family Response: The patient attended today's in-person session and engaged in the discussion regarding her ongoing struggles with anxiety and depression, which she reports  fluctuate in intensity. 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 The patient reports being on medical leave, which includes both maternity leave and leave for elevated blood pressure. She noted significant improvements in her health since being away from work, with her blood pressure stabilizing and her symptoms of depression and anxiety subsiding.  The patient is currently preparing for the arrival of her baby and is living with her parents. However, she expressed a desire for more independence and to secure her own living space due to ongoing relational tensions with her mother and stepfather. The patient acknowledged limited family support and expressed interest in exploring resources to assist with various challenges.  In response, the therapist provided information on available resources, including those related to parenting, mental health services, housing, employment, and financial support.   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 Since taking medical and maternity leave, her health has improved, with her blood pressure stabilizing and symptoms of anxiety and depression subsiding. She is also navigating relational stress with her parents and seeking more independence while preparing for the arrival of her baby. Patient also declined follow up appointment at this time and agrees to reschedule appointment if symptoms worsen.    Patient may benefit from continued support from integrated behavioral health.  Plan: Follow up with behavioral health clinician on : no follow up scheduled.  Behavioral recommendations: Adelee is encouraged to continue focusing on self-care and managing stress, especially as she prepares for the arrival of her  baby. It is recommended that she explore resources to address her housing, employment, and financial needs to increase stability and independence, while also  seeking ongoing support for her mental health. Additionally, maintaining a regular routine and incorporating relaxation techniques could further help manage anxiety and depression symptoms. Referral(s): Integrated [employer] (In Clinic) "From scale of 1-10, how likely are you to follow plan?": Patient agreeable to above plan.   Kaliah Haddaway Cruzita Lederer, LCSWA

## 2023-08-30 ENCOUNTER — Ambulatory Visit (HOSPITAL_COMMUNITY): Payer: Medicaid Other | Attending: Cardiology

## 2023-08-30 ENCOUNTER — Encounter: Payer: Self-pay | Admitting: Cardiology

## 2023-08-30 DIAGNOSIS — O10919 Unspecified pre-existing hypertension complicating pregnancy, unspecified trimester: Secondary | ICD-10-CM

## 2023-08-30 DIAGNOSIS — R0609 Other forms of dyspnea: Secondary | ICD-10-CM | POA: Diagnosis present

## 2023-08-30 LAB — ECHOCARDIOGRAM COMPLETE
Area-P 1/2: 4.1 cm2
S' Lateral: 3.5 cm

## 2023-09-05 MED ORDER — NIFEDIPINE ER OSMOTIC RELEASE 60 MG PO TB24: 60.0000 mg | ORAL_TABLET | Freq: Two times a day (BID) | ORAL | 5 refills | Status: DC

## 2023-09-05 NOTE — Addendum Note (Signed)
Addended by: Lindell Spar on: 09/05/2023 04:32 PM   Modules accepted: Orders

## 2023-09-06 ENCOUNTER — Ambulatory Visit: Payer: Medicaid Other

## 2023-09-06 ENCOUNTER — Ambulatory Visit: Payer: Medicaid Other | Attending: Maternal & Fetal Medicine

## 2023-09-06 ENCOUNTER — Other Ambulatory Visit: Payer: Self-pay | Admitting: *Deleted

## 2023-09-06 ENCOUNTER — Ambulatory Visit (HOSPITAL_BASED_OUTPATIENT_CLINIC_OR_DEPARTMENT_OTHER): Payer: Medicaid Other | Admitting: Obstetrics

## 2023-09-06 ENCOUNTER — Other Ambulatory Visit: Payer: Self-pay

## 2023-09-06 DIAGNOSIS — O99212 Obesity complicating pregnancy, second trimester: Secondary | ICD-10-CM | POA: Insufficient documentation

## 2023-09-06 DIAGNOSIS — E669 Obesity, unspecified: Secondary | ICD-10-CM

## 2023-09-06 DIAGNOSIS — R93429 Abnormal radiologic findings on diagnostic imaging of unspecified kidney: Secondary | ICD-10-CM

## 2023-09-06 DIAGNOSIS — O283 Abnormal ultrasonic finding on antenatal screening of mother: Secondary | ICD-10-CM

## 2023-09-06 DIAGNOSIS — Z3A25 25 weeks gestation of pregnancy: Secondary | ICD-10-CM

## 2023-09-06 DIAGNOSIS — Z31430 Encounter of female for testing for genetic disease carrier status for procreative management: Secondary | ICD-10-CM

## 2023-09-06 DIAGNOSIS — O358XX0 Maternal care for other (suspected) fetal abnormality and damage, not applicable or unspecified: Secondary | ICD-10-CM | POA: Diagnosis present

## 2023-09-06 DIAGNOSIS — O10012 Pre-existing essential hypertension complicating pregnancy, second trimester: Secondary | ICD-10-CM | POA: Diagnosis not present

## 2023-09-06 DIAGNOSIS — Z362 Encounter for other antenatal screening follow-up: Secondary | ICD-10-CM | POA: Insufficient documentation

## 2023-09-06 DIAGNOSIS — O35EXX0 Maternal care for other (suspected) fetal abnormality and damage, fetal genitourinary anomalies, not applicable or unspecified: Secondary | ICD-10-CM | POA: Diagnosis not present

## 2023-09-06 NOTE — Progress Notes (Signed)
MFM Consult Note  Bethany Pierce is currently at 25 weeks and 4 days.  She has been followed due to maternal obesity with a BMI of 62 and large echogenic kidneys.  She denies any problems since her last exam.    Due to the echogenic kidneys noted during her prior exam, her testing for toxoplasmosis and CMV were negative for an acute infection.  The overall EFW of 1 pound 14 ounces measures at the 70th percentile for her gestational age.  Bilateral enlarged echogenic kidneys continue to be noted.   She was reassured that as normal amniotic fluid along with a normal-appearing filled fetal bladder were noted today, the fetal kidneys are producing normal amounts of urine.    The possibility of autosomal recessive (infantile) polycystic kidney disease as the cause of the echogenic kidneys was discussed again.    The patient was advised that should the fetus have autosomal recessive infantile polycystic kidney disease, the clinical presentation after birth is highly variable and may include early onset hypertension, renal insufficiency, portal hypertension, and kidney failure requiring dialysis.    The possibility of a fetal chromosomal abnormality as the cause of the echogenic kidneys was discussed.  She was offered and declined an amniocentesis for definitive diagnosis.  The patient and her partner met with our genetic counselor today and underwent testing to determine if they are carriers for the autosomal recessive infantile polycystic kidney disease.  The views of the fetal anatomy remain limited today due to maternal body habitus and the fetal position.  The fetal cardiac views could not not be fully visualized.  She was offered and declined a referral to pediatric cardiology for a fetal echocardiogram.  She would like [place] to attempt to obtain the fetal cardiac views at her next exam.  We will plan on referral for a prenatal consultation with pediatric nephrology later in her pregnancy.  She  will return in 3 weeks for another ultrasound exam to assess the amniotic fluid levels, to attempt to complete the views of the fetal anatomy, and to assess the fetal kidneys.   We will start weekly fetal testing at 34 weeks.  The patient and her partner stated that all of their questions were answered today.  A total of 30 minutes was spent counseling and coordinating the care for this patient.  Greater than 50% of the time was spent in direct face-to-face contact.

## 2023-09-06 NOTE — Progress Notes (Signed)
[facility] at [facility] [address] Phone:  [phone]   Fax:  [phone]      In-Person Genetic Counseling Clinic Note:   I spoke with 23 y.o. Bethany Pierce today to discuss her recent prenatal ultrasound findings. She was referred by Center, [facility]. She was accompanied by her partner Bethany Pierce.   Pregnancy History:    G1P0. EGA: [redacted]w[redacted]d by LMP. EDD: 12/16/2023. Reports she takes PNVs and nifedipine. Developed hypertension during pregnancy. Denies personal history of diabetes, thyroid conditions, and seizures. Denies bleeding, infections, and fevers in this pregnancy. Denies using tobacco, alcohol, or street drugs in this pregnancy.   Family History:    A three-generation pedigree was created and scanned into Epic under the Media tab.  Maternal ethnicity reported as Black and paternal ethnicity reported as Black. Denies Ashkenazi Jewish ancestry.  Family history not remarkable for consanguinity, individuals with birth defects, intellectual disability, autism spectrum disorder, multiple spontaneous abortions, still births, or unexplained neonatal death.   Echogenic Kidneys:  Bethany Pierce's prenatal ultrasounds have noted echogenic kidneys. Please see ultrasound reports for details.  We reviewed that echogenic kidneys refer to kidneys that appear bright on ultrasound. Echogenic kidneys are associated with a wide range of kidney disorders and outcomes. The most frequent causes of fetal echogenic kidneys include polycystic kidney disease, multicystic dysplastic kidney, chromosomal abnormalities, overgrowth syndromes, a prenatal infection, or a urinary tract obstruction.  Dr. Parke Poisson, MFM, discussed with the couple the possibility of autosomal recessive kidney disease (ARPKD) as a differential diagnosis. ARPKD is a condition that is characterized by cysts in the kidneys that lead to kidney failure, cysts in the liver, and  problems in other organs, such as the blood vessels in the brain and heart. Other frequent complications include hypertension, excessive thirst, frequent urination, and feeding difficulties. Signs and symptoms of ARPKD are usually apparent at birth or in early infancy. Signs of the condition also may be identified prenatally. ARPKD can be severe neonatally, causing death due to respiratory failure. We reviewed that around 20% of patients with ARPKD pass during the newborn period, but those who survive the newborn period have significant chances of surviving to childhood due to recent improvements in therapies and treatments.   Since ARPKD is an autosomal recessive condition, we discussed and offered carrier screening for the couple for ARPKD. We discussed that if both are found to be carriers fro ARPKD, there would be a 25% chance for the fetus to be affected. However, we cannot confirm a diagnosis in the fetus without prenatal or postnatal genetic testing. The benefit and risks of carrier screening including that it cannot detect all carriers were discussed. We reviewed that negative carrier screening does not rule out a genetic condition in the fetus. We also briefly discussed the option of Vistara, single gene NIPS, that screens for conditions such as tuberous sclerosis that can present with polycystic kidneys. We reviewed that Vistara also screens for other autosomal dominant conditions. The couple elected carrier screening for ARPKD and declined Vistara. Their blood was drawn for carrier screening during today's visit. FOB consented we call Bethany Pierce with the results.  We also reviewed dominant forms of polycystic kidney diseases as well as other etiologies including other genetic conditions, chromosomal syndromes, and maternal infections. Finally, we reviewed that this may be a benign variant. We discussed and offered the option of amniocentesis for prenatal diagnosis. The technical aspects, benefits, risks,  and limitations including the  1 in 500 risk for preterm delivery were reviewed. The couple declined amniocentesis.   Previous Testing Completed:  Low risk NIPS: Bethany Pierce previously completed noninvasive prenatal screening (NIPS) in this pregnancy. The result is low risk, consistent with a female fetus. This screening significantly reduces but does not eliminate the chance that the current pregnancy has Down syndrome (trisomy 41), trisomy 27, trisomy 56, common sex chromosome conditions, and 22q11.2 microdeletion syndrome. Please see report for details. There are many genetic conditions that cannot be detected by NIPS.   Negative carrier screening: Bethany Pierce previously completed carrier screening. She screened to not be a carrier for cystic fibrosis (CF), spinal muscular atrophy (SMA), alpha thalassemia, and beta hemoglobinopathies. Please see report for details. A negative result on carrier screening reduces but does not eliminate the chance of being a carrier.   Negative ms-AFP screening: Bethany Pierce previously completed a maternal serum AFP screen in this pregnancy. The result is screen negative. Please see report for details. A negative result reduces the risk that the current pregnancy has an open neural tube defect. Closed neural tube defects and some open defects may not be detected by this screen.   Plan of Care:   Carrier screening for Bethany Pierce and FOB for autosomal recessive kidney disease was drawn today. He consented we call Bethany Pierce with the results. Bethany Pierce declined amniocentesis and Vistara. Routine prenatal care.   Informed consent was obtained. All questions were answered.   120 minutes were spent on the date of the encounter in service to the patient including preparation, face-to-face consultation, discussion of test reports and available next steps, pedigree construction, genetic risk assessment, documentation, and care coordination.    Thank you for sharing in the care of Bethany Pierce with [place].  Please do  not hesitate to contact us at [phone] if you have any questions.   Sheppard Plumber, MS Genetic Counselor   Genetic counseling student involved in appointment: No.

## 2023-09-09 ENCOUNTER — Ambulatory Visit (INDEPENDENT_AMBULATORY_CARE_PROVIDER_SITE_OTHER): Payer: Medicaid Other | Admitting: Cardiology

## 2023-09-09 ENCOUNTER — Encounter: Payer: Self-pay | Admitting: Cardiology

## 2023-09-09 VITALS — BP 123/77 | HR 105 | Ht 66.0 in | Wt >= 6400 oz

## 2023-09-09 DIAGNOSIS — Z3A26 26 weeks gestation of pregnancy: Secondary | ICD-10-CM | POA: Diagnosis not present

## 2023-09-09 DIAGNOSIS — O10919 Unspecified pre-existing hypertension complicating pregnancy, unspecified trimester: Secondary | ICD-10-CM

## 2023-09-09 NOTE — Patient Instructions (Signed)
 Medication Instructions:  Your physician recommends that you continue on your current medications as directed. Please refer to the Current Medication list given to you today.  *If you need a refill on your cardiac medications before your next appointment, please call your pharmacy*   Follow-Up: At [facility], you and your health needs are our priority.  As part of our continuing mission to provide you with exceptional heart care, we have created designated Provider Care Teams.  These Care Teams include your primary Cardiologist (physician) and Advanced Practice Providers (APPs -  Physician Assistants and Nurse Practitioners) who all work together to provide you with the care you need, when you need it.  Your next appointment:   8 week(s)  Provider:   Lattie Corns     Other Instructions:

## 2023-09-11 NOTE — Progress Notes (Signed)
Cardio-Obstetrics Clinic  Follow Up Note   Date:  09/11/2023   ID:  Bethany Pierce, DOB 04/07/2001, MRN [id number]  PCP:  Center, [facility]   [facility] Providers Cardiologist:  Thomasene Ripple, DO  Electrophysiologist:  None        Referring MD: Center, [facility]   Chief Complaint: " I am ok, blood pressure improving"  History of Present Illness:    Bethany Pierce is a 23 y.o. female [G1P0000] who returns for follow up of chronic hypertension in pregnancy.  She reports that her blood pressure has been well controlled at home. She had a brief period of elevated blood pressure, but it has since regulated. She is currently on Nifedipine 60mg  twice daily.  The patient also reports a whooshing sound in her right ear, which she describes as hearing her heartbeat. She does not experience lightheadedness or dizziness with this symptom, but has noticed it may be associated with headaches. The symptom has improved since an increase in her blood pressure medication.  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 She is being seen by her OB every two weeks.   Prior CV Studies Reviewed: The following studies were reviewed today:   Past Medical History:  Diagnosis Date   Hypertension    Medical history non-contributory     Past Surgical History:  Procedure Laterality Date   NO PAST SURGERIES     WISDOM TOOTH EXTRACTION Bilateral       OB History     Gravida  1   Para  0   Term  0   Preterm  0   AB  0   Living  0      SAB  0   IAB  0   Ectopic  0   Multiple  0   Live Births  0               Current Medications: Current Meds  Medication Sig   acetaminophen (TYLENOL) 500 MG tablet Take 500 mg by mouth every 6 (six) hours as needed for moderate pain.   aspirin EC 81 MG tablet Take 1 tablet (81 mg total) by mouth daily. Swallow whole.   Blood Pressure Monitoring (OMRON 3  SERIES BP MONITOR) DEVI Use to check blood pressure daily   NIFEdipine (PROCARDIA XL/NIFEDICAL XL) 60 MG 24 hr tablet Take 1 tablet (60 mg total) by mouth in the morning and at bedtime.   Prenatal Vit-Fe Fumarate-FA (PRENATAL VITAMIN PO) Take 1 tablet by mouth daily.     Allergies:   Patient has no known allergies.   Social History   Socioeconomic History   Marital status: Single    Spouse name: Not on file   Number of children: Not on file   Years of education: Not on file   Highest education level: Not on file  Occupational History   Not on file  Tobacco Use   Smoking status: Never    Passive exposure: Past   Smokeless tobacco: Never  Vaping Use   Vaping status: Never Used  Substance and Sexual Activity   Alcohol use: Not Currently    Comment: occ   Drug use: Never   Sexual activity: Yes    Partners: Male    Birth control/protection: Condom    Comment: menarche 23yo, sexual debut 23yo  Other Topics Concern   Not on file  Social History Narrative   Not  on file   Social Drivers of Health   Financial Resource Strain: Not on file  Food Insecurity: Not on file  Transportation Needs: Not on file  Physical Activity: Not on file  Stress: Not on file  Social Connections: Not on file      Family History  Problem Relation Age of Onset   Diabetes Mother    Hypertension Father    Asthma Brother    Asthma Maternal Grandmother    Diabetes Maternal Grandmother    Hypertension Paternal Grandmother    Cancer Neg Hx    Heart disease Neg Hx       ROS:   Please see the history of present illness.     All other systems reviewed and are negative.   Labs/EKG Reviewed:    EKG:   EKG was not  ordered today.   Recent Labs: 05/30/2023: Hemoglobin 12.1; Platelets 309 07/04/2023: ALT 23; BUN 7; Creatinine, Ser 0.72; Potassium 4.2; Sodium 138   Recent Lipid Panel No results found for: "CHOL", "TRIG", "HDL", "CHOLHDL", "LDLCALC", "LDLDIRECT"  Physical Exam:    VS:  BP  123/77 (BP Location: Left Arm, Patient Position: Sitting, Cuff Size: Large)   Pulse (!) 105   Ht 5\' 6"  (1.676 m)   Wt (!) 404 lb (183.3 kg)   LMP 03/11/2023   SpO2 99%   BMI 65.21 kg/m     Wt Readings from Last 3 Encounters:  09/09/23 (!) 404 lb (183.3 kg)  08/22/23 (!) 398 lb 12.8 oz (180.9 kg)  08/19/23 (!) 394 lb 6.4 oz (178.9 kg)     GEN:  Well nourished, well developed in no acute distress HEENT: Normal NECK: No JVD; No carotid bruits LYMPHATICS: No lymphadenopathy CARDIAC: RRR, no murmurs, rubs, gallops RESPIRATORY:  Clear to auscultation without rales, wheezing or rhonchi  ABDOMEN: Soft, non-tender, non-distended MUSCULOSKELETAL:  No edema; No deformity  SKIN: Warm and dry NEUROLOGIC:  Alert and oriented x 3 PSYCHIATRIC:  Normal affect    Risk Assessment/Risk Calculators:     CARPREG II Risk Prediction Index Score:  1.  The patient's risk for a primary cardiac event is 5%.   Modified World Health Organization [facility]) Classification of Maternal CV Risk   Class I         ASSESSMENT & PLAN:    Pregnancy with Hypertension Blood pressure well controlled on Nifedipine 60mg  twice daily. No symptoms of lightheadedness or dizziness. -Continue Nifedipine 60mg  twice daily. -Check blood pressure regularly at home. -Return for follow-up visit on April 2025.  Lifestyle modification advised   Patient Instructions  Medication Instructions:  Your physician recommends that you continue on your current medications as directed. Please refer to the Current Medication list given to you today.  *If you need a refill on your cardiac medications before your next appointment, please call your pharmacy*   Follow-Up: At [facility], you and your health needs are our priority.  As part of our continuing mission to provide you with exceptional heart care, we have created designated Provider Care Teams.  These Care Teams include your primary Cardiologist (physician) and  Advanced Practice Providers (APPs -  Physician Assistants and Nurse Practitioners) who all work together to provide you with the care you need, when you need it.  Your next appointment:   8 week(s)  Provider:   Thomasene Ripple, DO     Other Instructions:     Dispo:  No follow-ups on file.   Medication Adjustments/Labs and Tests Ordered: Current  medicines are reviewed at length with the patient today.  Concerns regarding medicines are outlined above.  Tests Ordered: No orders of the defined types were placed in this encounter.  Medication Changes: No orders of the defined types were placed in this encounter.

## 2023-09-12 ENCOUNTER — Encounter: Payer: Self-pay | Admitting: Advanced Practice Midwife

## 2023-09-14 ENCOUNTER — Telehealth: Payer: Self-pay

## 2023-09-14 NOTE — Telephone Encounter (Signed)
 Returned call, pt states that she tested positive for Chlamydia at Urgent Care on 09/09/23. Pt states that she completed Azithryomicin. Advised pt that partner needs to be treated, refrain from intercourse for at least a week and follow up testing in 4-6 weeks, pt agreed.

## 2023-09-15 ENCOUNTER — Ambulatory Visit: Payer: Medicaid Other

## 2023-09-19 LAB — HORIZON CUSTOM: REPORT SUMMARY: NEGATIVE

## 2023-09-20 ENCOUNTER — Telehealth: Payer: Self-pay

## 2023-09-20 NOTE — Telephone Encounter (Signed)
I called the patient to discuss her and FOB's Gilles Chiquito DOB 06/18/1989) recent Horizon carrier screening results for autosomal recessive polycystic kidney disease (ARPKD). They were not found to be carriers for ARPKD. This significantly reduces but does not eliminate the chance that they are carriers and that the fetus is affected. Please see reports for details.  We discussed that ARPKD in the fetus is unlikely; however, we still do not know the cause of the cystic kidneys on ultrasound. We reviewed remaining options, such as amniocentesis, postnatal testing, or Vistara. [name] shared that she may elect amniocentesis depending on the results of her next ultrasound.  Sheppard Plumber, MS Genetic Counselor [facility] [phone]

## 2023-09-26 ENCOUNTER — Ambulatory Visit (INDEPENDENT_AMBULATORY_CARE_PROVIDER_SITE_OTHER): Payer: Medicaid Other | Admitting: Obstetrics & Gynecology

## 2023-09-26 ENCOUNTER — Other Ambulatory Visit: Payer: Medicaid Other

## 2023-09-26 ENCOUNTER — Other Ambulatory Visit: Payer: Self-pay | Admitting: *Deleted

## 2023-09-26 ENCOUNTER — Encounter: Payer: Self-pay | Admitting: *Deleted

## 2023-09-26 ENCOUNTER — Other Ambulatory Visit: Payer: Self-pay

## 2023-09-26 ENCOUNTER — Ambulatory Visit: Payer: Medicaid Other | Attending: Obstetrics

## 2023-09-26 ENCOUNTER — Telehealth: Payer: Self-pay

## 2023-09-26 ENCOUNTER — Ambulatory Visit: Payer: Medicaid Other | Admitting: *Deleted

## 2023-09-26 VITALS — BP 131/87 | HR 92 | Wt >= 6400 oz

## 2023-09-26 VITALS — BP 137/82 | HR 92

## 2023-09-26 DIAGNOSIS — O99212 Obesity complicating pregnancy, second trimester: Secondary | ICD-10-CM | POA: Insufficient documentation

## 2023-09-26 DIAGNOSIS — O0990 Supervision of high risk pregnancy, unspecified, unspecified trimester: Secondary | ICD-10-CM | POA: Diagnosis present

## 2023-09-26 DIAGNOSIS — O10012 Pre-existing essential hypertension complicating pregnancy, second trimester: Secondary | ICD-10-CM | POA: Diagnosis not present

## 2023-09-26 DIAGNOSIS — Z23 Encounter for immunization: Secondary | ICD-10-CM

## 2023-09-26 DIAGNOSIS — O10919 Unspecified pre-existing hypertension complicating pregnancy, unspecified trimester: Secondary | ICD-10-CM

## 2023-09-26 DIAGNOSIS — O35EXX0 Maternal care for other (suspected) fetal abnormality and damage, fetal genitourinary anomalies, not applicable or unspecified: Secondary | ICD-10-CM

## 2023-09-26 DIAGNOSIS — E669 Obesity, unspecified: Secondary | ICD-10-CM

## 2023-09-26 DIAGNOSIS — R93429 Abnormal radiologic findings on diagnostic imaging of unspecified kidney: Secondary | ICD-10-CM | POA: Diagnosis present

## 2023-09-26 DIAGNOSIS — O99210 Obesity complicating pregnancy, unspecified trimester: Secondary | ICD-10-CM | POA: Diagnosis not present

## 2023-09-26 DIAGNOSIS — O98519 Other viral diseases complicating pregnancy, unspecified trimester: Secondary | ICD-10-CM

## 2023-09-26 DIAGNOSIS — B009 Herpesviral infection, unspecified: Secondary | ICD-10-CM

## 2023-09-26 DIAGNOSIS — Z3A28 28 weeks gestation of pregnancy: Secondary | ICD-10-CM

## 2023-09-26 DIAGNOSIS — O99213 Obesity complicating pregnancy, third trimester: Secondary | ICD-10-CM

## 2023-09-26 DIAGNOSIS — Z3A27 27 weeks gestation of pregnancy: Secondary | ICD-10-CM

## 2023-09-26 NOTE — Progress Notes (Signed)
 States had food this AM.Did not know about repeat GTT.

## 2023-09-26 NOTE — Progress Notes (Signed)
   PRENATAL VISIT NOTE  Subjective:  Bethany Pierce is a 23 y.o. G1P0000 at [redacted]w[redacted]d being seen today for ongoing prenatal care.  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  Patient reports some SOB. She denies a h/o asthma or wheezing.  Contractions: Not present. Vag. Bleeding: None.  Movement: Present. Denies leaking of fluid.   The following portions of the patient's history were reviewed and updated as appropriate: allergies, current medications, past family history, past medical history, past social history, past surgical history and problem list.   Objective:   Vitals:   09/26/23 0819  BP: 131/87  Pulse: 92  Weight: (!) 415 lb (188.2 kg)    Fetal Status:     Movement: Present     General:  Alert, oriented and cooperative. Patient is in no acute distress.  Skin: Skin is warm and dry. No rash noted.   Cardiovascular: Normal heart rate noted  Respiratory: Normal respiratory effort, no problems with respiration noted  Abdomen: Soft, gravid, appropriate for gestational age.  Pain/Pressure: Absent     Pelvic: Cervical exam deferred        Extremities: Normal range of motion.  Edema: Trace  Mental Status: Normal mood and affect. Normal behavior. Normal judgment and thought content.   Assessment and Plan:  Pregnancy: G1P0000 at [redacted]w[redacted]d 1. Chronic hypertension affecting pregnancy - on baby asa, procardia 60 mg daily - will have serial MFM scans, one today  2. Supervision of high risk pregnancy, antepartum (Primary) - She did not fast this morning, so she will need to get her labs done tomorrow - CBC - HIV antibody  (with reflex) - RPR - Glucose Tolerance, 2 Hours w/1 Hour - TDAP today  3. Obesity affecting pregnancy, antepartum, unspecified obesity type   4. 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 [redacted] weeks gestation of pregnancy   Preterm labor symptoms and general obstetric precautions including but not limited to vaginal bleeding, contractions, leaking of fluid and fetal movement were reviewed in detail with the patient. Please refer to After Visit Summary for other counseling recommendations.   Return in about 2 weeks (around 10/10/2023).  Future Appointments  Date Time Provider Department Center  09/26/2023  8:45 AM [facility] LAB [facility] None  09/26/2023  1:15 PM [facility] NURSE [facility]  09/26/2023  1:30 PM [facility] US6 [facility]  10/27/2023  3:30 PM [facility] PHARMACIST [facility] None  11/04/2023  1:20 PM Tobb, Lavona Mound, DO [facility] None    Allie Bossier, MD

## 2023-09-27 ENCOUNTER — Ambulatory Visit: Payer: Self-pay

## 2023-09-27 ENCOUNTER — Other Ambulatory Visit: Payer: Self-pay | Admitting: *Deleted

## 2023-09-27 DIAGNOSIS — O99213 Obesity complicating pregnancy, third trimester: Secondary | ICD-10-CM

## 2023-09-27 DIAGNOSIS — O10919 Unspecified pre-existing hypertension complicating pregnancy, unspecified trimester: Secondary | ICD-10-CM

## 2023-09-28 ENCOUNTER — Other Ambulatory Visit: Payer: Medicaid Other

## 2023-09-28 DIAGNOSIS — O0990 Supervision of high risk pregnancy, unspecified, unspecified trimester: Secondary | ICD-10-CM

## 2023-09-28 DIAGNOSIS — Z3A28 28 weeks gestation of pregnancy: Secondary | ICD-10-CM

## 2023-09-29 ENCOUNTER — Encounter: Payer: Self-pay | Admitting: Advanced Practice Midwife

## 2023-09-29 ENCOUNTER — Encounter (HOSPITAL_COMMUNITY): Payer: Self-pay | Admitting: Obstetrics & Gynecology

## 2023-09-29 ENCOUNTER — Telehealth: Payer: Self-pay

## 2023-09-29 ENCOUNTER — Inpatient Hospital Stay (HOSPITAL_COMMUNITY)
Admission: AD | Admit: 2023-09-29 | Discharge: 2023-09-29 | Disposition: A | Discharge status: Home / Self Care | Payer: Medicaid Other | Attending: Obstetrics & Gynecology | Admitting: Obstetrics & Gynecology

## 2023-09-29 DIAGNOSIS — N898 Other specified noninflammatory disorders of vagina: Secondary | ICD-10-CM

## 2023-09-29 DIAGNOSIS — O09893 Supervision of other high risk pregnancies, third trimester: Secondary | ICD-10-CM

## 2023-09-29 DIAGNOSIS — Z3A28 28 weeks gestation of pregnancy: Secondary | ICD-10-CM | POA: Diagnosis not present

## 2023-09-29 DIAGNOSIS — R8290 Unspecified abnormal findings in urine: Secondary | ICD-10-CM | POA: Insufficient documentation

## 2023-09-29 DIAGNOSIS — O26893 Other specified pregnancy related conditions, third trimester: Secondary | ICD-10-CM | POA: Insufficient documentation

## 2023-09-29 LAB — CBC
Hematocrit: 34.9 % (ref 34.0–46.6)
Hemoglobin: 11.2 g/dL (ref 11.1–15.9)
MCH: 31.5 pg (ref 26.6–33.0)
MCHC: 32.1 g/dL (ref 31.5–35.7)
MCV: 98 fL — ABNORMAL HIGH (ref 79–97)
Platelets: 297 10*3/uL (ref 150–450)
RBC: 3.55 x10E6/uL — ABNORMAL LOW (ref 3.77–5.28)
RDW: 12.7 % (ref 11.7–15.4)
WBC: 10.2 10*3/uL (ref 3.4–10.8)

## 2023-09-29 LAB — GLUCOSE TOLERANCE, 2 HOURS W/ 1HR
Glucose, 1 hour: 91 mg/dL (ref 70–179)
Glucose, 2 hour: 94 mg/dL (ref 70–152)
Glucose, Fasting: 68 mg/dL — ABNORMAL LOW (ref 70–91)

## 2023-09-29 LAB — URINALYSIS, ROUTINE W REFLEX MICROSCOPIC
Bilirubin Urine: NEGATIVE
Glucose, UA: NEGATIVE mg/dL
Hgb urine dipstick: NEGATIVE
Ketones, ur: NEGATIVE mg/dL
Nitrite: NEGATIVE
Protein, ur: NEGATIVE mg/dL
Specific Gravity, Urine: 1.019 (ref 1.005–1.030)
pH: 6 (ref 5.0–8.0)

## 2023-09-29 LAB — WET PREP, GENITAL
Clue Cells Wet Prep HPF POC: NONE SEEN
Sperm: NONE SEEN
Trich, Wet Prep: NONE SEEN
WBC, Wet Prep HPF POC: <10 (ref <10)
Yeast Wet Prep HPF POC: NONE SEEN

## 2023-09-29 LAB — HIV ANTIBODY (ROUTINE TESTING W REFLEX): HIV Screen 4th Generation wRfx: NONREACTIVE

## 2023-09-29 LAB — RPR: RPR Ser Ql: NONREACTIVE

## 2023-09-29 MED ORDER — ONDANSETRON 4 MG PO TBDP
8.0000 mg | ORAL_TABLET | Freq: Once | ORAL | Status: AC
  Administered 2023-09-29: 8 mg via ORAL
  Filled 2023-09-29: qty 2

## 2023-09-29 MED ORDER — AZITHROMYCIN 250 MG PO TABS
1000.0000 mg | ORAL_TABLET | Freq: Once | ORAL | Status: AC
  Administered 2023-09-29: 1000 mg via ORAL
  Filled 2023-09-29: qty 4

## 2023-09-29 NOTE — MAU Provider Note (Signed)
 Menstrual Cramps, Brown vaginal d/c   S [name] is a 23 y.o. G1P0000 pregnant  female at [redacted]w[redacted]d who presents to MAU today with complaint of pelvic cramping and brown vaginal d/c. Pt states for a few days she's been having pelvic cramping similar to menses.  Currently 1/10 but was 9/10 last night while she was laying down.  Noticed brown d/c around 1730 on 2/19.  Not having to wear pad and only noticing a small amount with urination. Denies VB, LOF.  While she feels baby moving she reports less movement since 2/19.  Of note pt states she had the brown dc 3 weeks ago when she was dx'ed with Chlamydia.  S/p amoxicillin one time dosing at Urgent Care.  Believes partner was treated but no sexual contact with that female partner since.  Had protected coitus with new female partner 09/26/23.    Receives care at [facility]. Prenatal records reviewed.  Pertinent items noted in HPI and remainder of comprehensive ROS otherwise negative.   O BP 93/80 (BP Location: Left Arm)   Pulse 99   Temp 97.6 F (36.4 C) (Oral)   Resp 18   Ht 5\' 6"  (1.676 m)   Wt (!) 187.6 kg   LMP 03/11/2023   SpO2 100%   BMI 66.74 kg/m  Physical Exam Vitals and nursing note reviewed.  Constitutional:      General: She is not in acute distress.    Appearance: She is well-developed. She is obese. She is not ill-appearing.  HENT:     Head: Normocephalic and atraumatic.     Mouth/Throat:     Mouth: Mucous membranes are moist.  Eyes:     Extraocular Movements: Extraocular movements intact.  Cardiovascular:     Rate and Rhythm: Normal rate.  Pulmonary:     Effort: Pulmonary effort is normal. No respiratory distress.  Abdominal:     General: Abdomen is flat. There is no distension.     Palpations: Abdomen is soft.     Tenderness: There is no abdominal tenderness.  Skin:    General: Skin is warm and dry.  Neurological:     Mental Status: She is alert and oriented to person, place, and time.     Motor: No weakness.   Psychiatric:        Mood and Affect: Mood normal.        Behavior: Behavior normal.    NST: 145bpm, moderate variability, +accels, no decels, no ctx    MDM: Low  MAU Course:  UA cloudy, small LE, rare bacteria, sending for OB Ucx  Wet prep negative  GC collected   A/P #[redacted] weeks gestation #Brown d/c: similar presentation with prior chlamydia infection, thusly will give 1g Azithromycin as treatment here and f/u GC swap  Discharge from MAU in stable condition with strict/usual precautions Follow up at [facility] as scheduled for ongoing prenatal care  Allergies as of 09/29/2023   No Known Allergies      Medication List     TAKE these medications    acetaminophen 500 MG tablet Commonly known as: TYLENOL Take 500 mg by mouth every 6 (six) hours as needed for moderate pain.   aspirin EC 81 MG tablet Take 1 tablet (81 mg total) by mouth daily. Swallow whole.   NIFEdipine 60 MG 24 hr tablet Commonly known as: PROCARDIA XL/NIFEDICAL XL Take 1 tablet (60 mg total) by mouth in the morning and at bedtime.   Omron 3 Series BP Monitor [employer]  Use to check blood pressure daily   PRENATAL VITAMIN PO Take 1 tablet by mouth daily.        Hessie Dibble, MD 09/29/2023 1:40 PM

## 2023-09-29 NOTE — MAU Note (Signed)
.  Bethany Pierce is a 23 y.o. at [redacted]w[redacted]d here in MAU reporting: started having "menstrual cramps" a few days ago. Pain is intermittent. She rates it at 1/10 now, but last night it got up to a 9/10 when laying down. Also started having brown discharge last night around 1730 - not wearing a pad, only notices a small amount with urination. Denies any VB or watery LOF. Reports FM, but has noticed less than normal movement since yesterday.  LMP: N/A Onset of complaint: Past several days Pain score: 1/10 currently Vitals:   09/29/23 1223  BP: 129/75  Pulse: 86  Resp: 16  Temp: 97.9 F (36.6 C)  SpO2: 100%     FHT: 154  Lab orders placed from triage: UA

## 2023-09-29 NOTE — Telephone Encounter (Signed)
 S/w pt who reported increasing cramps over the last 2 days which have now caused her to have brown discharge. Advised pt that office is closing at 12 today, be evaluated at hospital, pt agreed.

## 2023-09-30 LAB — CULTURE, OB URINE: Special Requests: NORMAL

## 2023-09-30 LAB — GC/CHLAMYDIA PROBE AMP (~~LOC~~) NOT AT ARMC
Chlamydia: NEGATIVE
Comment: NEGATIVE
Comment: NORMAL
Neisseria Gonorrhea: NEGATIVE

## 2023-10-10 ENCOUNTER — Ambulatory Visit (INDEPENDENT_AMBULATORY_CARE_PROVIDER_SITE_OTHER): Payer: Medicaid Other | Admitting: Obstetrics & Gynecology

## 2023-10-10 VITALS — BP 118/82 | HR 90 | Wt >= 6400 oz

## 2023-10-10 DIAGNOSIS — O99210 Obesity complicating pregnancy, unspecified trimester: Secondary | ICD-10-CM | POA: Diagnosis not present

## 2023-10-10 DIAGNOSIS — B009 Herpesviral infection, unspecified: Secondary | ICD-10-CM

## 2023-10-10 DIAGNOSIS — O2603 Excessive weight gain in pregnancy, third trimester: Secondary | ICD-10-CM | POA: Insufficient documentation

## 2023-10-10 DIAGNOSIS — O98519 Other viral diseases complicating pregnancy, unspecified trimester: Secondary | ICD-10-CM

## 2023-10-10 DIAGNOSIS — Z3A30 30 weeks gestation of pregnancy: Secondary | ICD-10-CM | POA: Diagnosis not present

## 2023-10-10 DIAGNOSIS — O10919 Unspecified pre-existing hypertension complicating pregnancy, unspecified trimester: Secondary | ICD-10-CM

## 2023-10-10 DIAGNOSIS — O0990 Supervision of high risk pregnancy, unspecified, unspecified trimester: Secondary | ICD-10-CM

## 2023-10-10 DIAGNOSIS — Z6841 Body Mass Index (BMI) 40.0 and over, adult: Secondary | ICD-10-CM | POA: Diagnosis not present

## 2023-10-10 NOTE — Progress Notes (Signed)
   PRENATAL VISIT NOTE  Subjective:  Bethany Pierce is a 23 y.o. G1P0000 at [redacted]w[redacted]d being seen today for ongoing prenatal care.  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  Patient reports no complaints.  Contractions: Irregular. Vag. Bleeding: None.  Movement: Present. Denies leaking of fluid.   The following portions of the patient's history were reviewed and updated as appropriate: allergies, current medications, past family history, past medical history, past social history, past surgical history and problem list.   Objective:   Vitals:   10/10/23 0858  BP: 118/82  Pulse: 90  Weight: (!) 417 lb (189.1 kg)    Fetal Status:     Movement: Present     FHR- 128  General:  Alert, oriented and cooperative. Patient is in no acute distress.  Skin: Skin is warm and dry. No rash noted.   Cardiovascular: Normal heart rate noted  Respiratory: Normal respiratory effort, no problems with respiration noted  Abdomen: Soft, gravid, appropriate for gestational age.  Pain/Pressure: Absent     Pelvic: Cervical exam deferred        Extremities: Normal range of motion.     Mental Status: Normal mood and affect. Normal behavior. Normal judgment and thought content.   Assessment and Plan:  Pregnancy: G1P0000 at [redacted]w[redacted]d 1. [redacted] weeks gestation of pregnancy (Primary)   2. 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 BMI 60.0-69.9, adult (HCC)  4. Obesity affecting pregnancy, antepartum, unspecified obesity type  5.  Supervision of high risk pregnancy, antepartum   6. Chronic hypertension affecting pregnancy - excellent BP on procardia - taking baby asa - MFM follow ups and BPPs already scheduled  7. Excessive weight gain during pregnancy in third trimester - check TSH/free T4  Preterm labor symptoms and general obstetric precautions including but not limited to vaginal bleeding, contractions, leaking of fluid and fetal movement were reviewed in detail with the patient. Please refer to After Visit Summary for other counseling recommendations.   Return in about 2 weeks (around 10/24/2023).  Future Appointments  Date Time Provider Department Center  10/24/2023  8:55 AM Allie Bossier, MD [facility] None  10/25/2023  2:30 PM [facility] US5 [facility]  10/27/2023  3:30 PM [facility] PHARMACIST [facility] None  10/31/2023  2:30 PM [facility] US5 [facility]  11/04/2023  1:20 PM Tobb, Kardie, DO [facility] None  11/07/2023  8:55 AM Allie Bossier, MD [facility] None  11/07/2023  2:30 PM [facility] US3 [facility]  11/14/2023  9:30 AM [facility] US3 [facility]  11/21/2023  9:30 AM [facility] US3 [facility]    Destiny Trickey Inda Coke, MD

## 2023-10-11 ENCOUNTER — Other Ambulatory Visit: Payer: Self-pay | Admitting: Obstetrics & Gynecology

## 2023-10-11 ENCOUNTER — Encounter: Payer: Self-pay | Admitting: Obstetrics & Gynecology

## 2023-10-11 DIAGNOSIS — E039 Hypothyroidism, unspecified: Secondary | ICD-10-CM | POA: Insufficient documentation

## 2023-10-11 LAB — TSH+FREE T4
Free T4: 0.66 ng/dL — ABNORMAL LOW (ref 0.82–1.77)
TSH: 4.73 u[IU]/mL — ABNORMAL HIGH (ref 0.450–4.500)

## 2023-10-11 MED ORDER — LEVOTHYROXINE SODIUM 25 MCG PO TABS
25.0000 ug | ORAL_TABLET | Freq: Every day | ORAL | 5 refills | Status: DC
Start: 2023-10-11 — End: 2023-10-24

## 2023-10-11 NOTE — Progress Notes (Signed)
 Synthroid prescribed. Mychart message sent

## 2023-10-24 ENCOUNTER — Ambulatory Visit (INDEPENDENT_AMBULATORY_CARE_PROVIDER_SITE_OTHER): Payer: Medicaid Other | Admitting: Obstetrics & Gynecology

## 2023-10-24 VITALS — BP 134/91 | HR 88 | Wt >= 6400 oz

## 2023-10-24 DIAGNOSIS — Z6841 Body Mass Index (BMI) 40.0 and over, adult: Secondary | ICD-10-CM

## 2023-10-24 DIAGNOSIS — O99210 Obesity complicating pregnancy, unspecified trimester: Secondary | ICD-10-CM

## 2023-10-24 DIAGNOSIS — O98519 Other viral diseases complicating pregnancy, unspecified trimester: Secondary | ICD-10-CM

## 2023-10-24 DIAGNOSIS — E039 Hypothyroidism, unspecified: Secondary | ICD-10-CM | POA: Diagnosis not present

## 2023-10-24 DIAGNOSIS — O2603 Excessive weight gain in pregnancy, third trimester: Secondary | ICD-10-CM

## 2023-10-24 DIAGNOSIS — B009 Herpesviral infection, unspecified: Secondary | ICD-10-CM

## 2023-10-24 MED ORDER — LEVOTHYROXINE SODIUM 50 MCG PO TABS: 50.0000 ug | ORAL_TABLET | Freq: Every day | ORAL | 4 refills | Status: DC

## 2023-10-24 MED ORDER — VALACYCLOVIR HCL 1 G PO TABS: ORAL_TABLET | ORAL | 5 refills | Status: DC

## 2023-10-24 NOTE — Progress Notes (Signed)
 Pt presents for rob. Pt has no questions or concerns at this time.

## 2023-10-24 NOTE — Progress Notes (Signed)
   PRENATAL VISIT NOTE  Subjective:  Bethany Pierce is a 23 y.o. G1P0000 at [redacted]w[redacted]d being seen today for ongoing prenatal care.  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  Patient reports  fatigue .  Contractions: Not present. Vag. Bleeding: None.  Movement: Present. Denies leaking of fluid.   The following portions of the patient's history were reviewed and updated as appropriate: allergies, current medications, past family history, past medical history, past social history, past surgical history and problem list.   Objective:   Vitals:   10/24/23 0846  BP: (!) 134/91  Pulse: 88  Weight: (!) 420 lb 9.6 oz (190.8 kg)    Fetal Status: Fetal Heart Rate (bpm): 124   Movement: Present     General:  Alert, oriented and cooperative. Patient is in no acute distress.  Skin: Skin is warm and dry. No rash noted.   Cardiovascular: Normal heart rate noted  Respiratory: Normal respiratory effort, no problems with respiration noted  Abdomen: Soft, gravid, appropriate for gestational age.  Pain/Pressure: Absent     Pelvic: Cervical exam deferred        Extremities: Normal range of motion.  Edema: Trace (feet)  Mental Status: Normal mood and affect. Normal behavior. Normal judgment and thought content.   Assessment and Plan:  Pregnancy: G1P0000 at [redacted]w[redacted]d 1. Hypothyroidism, unspecified type (Primary) - due to her low TSH and free T4 on 25 mcg of synthroid, I have increased the dose to 50 mcg daily  2. Severe obesity due to excess calories affecting pregnancy,  antepartum (HCC) - weekly MFM BPP and monthy MFM scans  3. BMI 60.0-69.9, adult (HCC) - on asa daily  4. 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 Excessive weight gain during pregnancy in third trimester  6. CHTN- normally good BP on procardia 10 mg daily, but she forgot to take her meds today. She denies any symptoms of pre eclampsia, aware of Pre E precautions.  Preterm labor symptoms and general obstetric precautions including but not limited to vaginal bleeding, contractions, leaking of fluid and fetal movement were reviewed in detail with the patient. Please refer to After Visit Summary for other counseling recommendations.   No follow-ups on file.  Future Appointments  Date Time Provider Department Center  10/25/2023  2:30 PM [facility] US5 [facility]  10/27/2023  3:30 PM [facility]-NLINE PHARMACIST [facility] None  10/31/2023  2:30 PM [facility] US5 [facility]  11/04/2023  1:20 PM Tobb, Kardie, DO [facility] None  11/07/2023  8:55 AM Allie Bossier, MD [facility] None  11/07/2023  2:30 PM [facility] US3 [facility]  11/14/2023  9:30 AM [facility] US3 [facility]  11/21/2023  9:30 AM [facility] US3 [facility]    Endora Teresi Inda Coke, MD

## 2023-10-25 ENCOUNTER — Other Ambulatory Visit: Payer: Self-pay

## 2023-10-25 ENCOUNTER — Other Ambulatory Visit: Payer: Self-pay | Admitting: Obstetrics and Gynecology

## 2023-10-25 ENCOUNTER — Ambulatory Visit: Payer: Medicaid Other | Attending: Obstetrics and Gynecology

## 2023-10-25 DIAGNOSIS — O10919 Unspecified pre-existing hypertension complicating pregnancy, unspecified trimester: Secondary | ICD-10-CM

## 2023-10-25 DIAGNOSIS — E669 Obesity, unspecified: Secondary | ICD-10-CM | POA: Diagnosis not present

## 2023-10-25 DIAGNOSIS — O99213 Obesity complicating pregnancy, third trimester: Secondary | ICD-10-CM

## 2023-10-25 DIAGNOSIS — O10013 Pre-existing essential hypertension complicating pregnancy, third trimester: Secondary | ICD-10-CM | POA: Diagnosis not present

## 2023-10-25 DIAGNOSIS — Z3A32 32 weeks gestation of pregnancy: Secondary | ICD-10-CM

## 2023-10-25 DIAGNOSIS — O35EXX0 Maternal care for other (suspected) fetal abnormality and damage, fetal genitourinary anomalies, not applicable or unspecified: Secondary | ICD-10-CM

## 2023-10-27 ENCOUNTER — Ambulatory Visit: Payer: Medicaid Other | Attending: Cardiovascular Disease

## 2023-10-27 DIAGNOSIS — L83 Acanthosis nigricans: Secondary | ICD-10-CM | POA: Insufficient documentation

## 2023-10-27 DIAGNOSIS — E559 Vitamin D deficiency, unspecified: Secondary | ICD-10-CM | POA: Insufficient documentation

## 2023-10-31 ENCOUNTER — Ambulatory Visit

## 2023-10-31 ENCOUNTER — Ambulatory Visit: Payer: Medicaid Other | Attending: Obstetrics and Gynecology

## 2023-10-31 DIAGNOSIS — O99213 Obesity complicating pregnancy, third trimester: Secondary | ICD-10-CM | POA: Insufficient documentation

## 2023-10-31 DIAGNOSIS — O10919 Unspecified pre-existing hypertension complicating pregnancy, unspecified trimester: Secondary | ICD-10-CM | POA: Insufficient documentation

## 2023-10-31 DIAGNOSIS — O10013 Pre-existing essential hypertension complicating pregnancy, third trimester: Secondary | ICD-10-CM

## 2023-10-31 DIAGNOSIS — Z3A32 32 weeks gestation of pregnancy: Secondary | ICD-10-CM | POA: Diagnosis not present

## 2023-10-31 DIAGNOSIS — O35EXX0 Maternal care for other (suspected) fetal abnormality and damage, fetal genitourinary anomalies, not applicable or unspecified: Secondary | ICD-10-CM

## 2023-11-04 ENCOUNTER — Ambulatory Visit: Payer: Medicaid Other | Admitting: Cardiology

## 2023-11-07 ENCOUNTER — Encounter: Payer: Self-pay | Admitting: Obstetrics and Gynecology

## 2023-11-07 ENCOUNTER — Ambulatory Visit (HOSPITAL_BASED_OUTPATIENT_CLINIC_OR_DEPARTMENT_OTHER): Admitting: Maternal & Fetal Medicine

## 2023-11-07 ENCOUNTER — Ambulatory Visit (INDEPENDENT_AMBULATORY_CARE_PROVIDER_SITE_OTHER): Payer: Medicaid Other | Admitting: Obstetrics & Gynecology

## 2023-11-07 ENCOUNTER — Ambulatory Visit: Payer: Medicaid Other | Attending: Maternal & Fetal Medicine

## 2023-11-07 ENCOUNTER — Encounter: Payer: Self-pay | Admitting: Obstetrics & Gynecology

## 2023-11-07 VITALS — BP 136/86 | HR 93 | Wt >= 6400 oz

## 2023-11-07 DIAGNOSIS — E669 Obesity, unspecified: Secondary | ICD-10-CM | POA: Diagnosis not present

## 2023-11-07 DIAGNOSIS — O0990 Supervision of high risk pregnancy, unspecified, unspecified trimester: Secondary | ICD-10-CM

## 2023-11-07 DIAGNOSIS — Z6841 Body Mass Index (BMI) 40.0 and over, adult: Secondary | ICD-10-CM

## 2023-11-07 DIAGNOSIS — O98519 Other viral diseases complicating pregnancy, unspecified trimester: Secondary | ICD-10-CM

## 2023-11-07 DIAGNOSIS — O99210 Obesity complicating pregnancy, unspecified trimester: Secondary | ICD-10-CM | POA: Diagnosis not present

## 2023-11-07 DIAGNOSIS — O35EXX0 Maternal care for other (suspected) fetal abnormality and damage, fetal genitourinary anomalies, not applicable or unspecified: Secondary | ICD-10-CM | POA: Insufficient documentation

## 2023-11-07 DIAGNOSIS — Z3A33 33 weeks gestation of pregnancy: Secondary | ICD-10-CM

## 2023-11-07 DIAGNOSIS — Z3A34 34 weeks gestation of pregnancy: Secondary | ICD-10-CM | POA: Insufficient documentation

## 2023-11-07 DIAGNOSIS — O99213 Obesity complicating pregnancy, third trimester: Secondary | ICD-10-CM | POA: Diagnosis present

## 2023-11-07 DIAGNOSIS — E039 Hypothyroidism, unspecified: Secondary | ICD-10-CM

## 2023-11-07 DIAGNOSIS — O2603 Excessive weight gain in pregnancy, third trimester: Secondary | ICD-10-CM

## 2023-11-07 DIAGNOSIS — B009 Herpesviral infection, unspecified: Secondary | ICD-10-CM

## 2023-11-07 DIAGNOSIS — O10013 Pre-existing essential hypertension complicating pregnancy, third trimester: Secondary | ICD-10-CM

## 2023-11-07 DIAGNOSIS — O10919 Unspecified pre-existing hypertension complicating pregnancy, unspecified trimester: Secondary | ICD-10-CM

## 2023-11-07 NOTE — Progress Notes (Signed)
 Pt presents for ROB visit. Pt reports spotting 2 days ago. No bleeding since then.

## 2023-11-07 NOTE — Progress Notes (Signed)
 PRENATAL VISIT NOTE  Subjective:  Bethany Pierce is a 23 y.o. G1P0000 at [redacted]w[redacted]d being seen today for ongoing prenatal care.  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  Patient reports  that she has intermittent "seeing spots" for several weeks. She also has intermittent headaches, relieved with tylenol .  Contractions: Irritability. Vag. Bleeding: None.  Movement: Present. Denies leaking of fluid.   The following portions of the patient's history were reviewed and updated as appropriate: allergies, current medications, past family history, past medical history, past social history, past surgical history and problem list.   Objective:   Vitals:   11/07/23 0848 11/07/23 0853  BP: (!) 140/79 136/86  Pulse: 93 93  Weight: (!) 421 lb (191 kg)     Fetal Status: Fetal Heart Rate (bpm): 136   Movement: Present     General:  Alert, oriented and cooperative. Patient is in no acute distress.  Skin: Skin is warm and dry. No rash noted.   Cardiovascular: Normal heart rate noted  Respiratory: Normal respiratory effort, no problems with respiration noted  Abdomen: Soft, gravid, appropriate for gestational age.  Pain/Pressure: Present     Pelvic: Cervical exam deferred        Extremities: Normal range of motion.  Edema: Trace  Mental Status: Normal mood and affect. Normal behavior. Normal judgment and thought content.   DTR 1+ bilaterally She has pedal edema 2+.   Assessment and Plan:  Pregnancy: G1P0000 at  [redacted]w[redacted]d 1. Chronic hypertension affecting pregnancy (Primary) - I don't feel that she has Pre E at this time, but she is certainly at risk. We have Again discussed the precautions.  - Protein / creatinine ratio, urine - CBC - Comp Met (CMET)  2. Hypothyroidism, unspecified type - on synthroid and TSH/T4 checked recently  3. Supervision of high risk pregnancy, antepartum - weekly MFM BPPs  4. Severe obesity due to excess calories affecting pregnancy, antepartum (HCC)   5. BMI 60.0-69.9, adult (HCC)   6. 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 Excessive weight gain during pregnancy in third trimester   8. [redacted] weeks gestation of pregnancy - will get cervical cultures at next visit  Preterm labor symptoms and general obstetric precautions including but not limited to vaginal bleeding, contractions, leaking of fluid and fetal movement were reviewed in detail with the patient. Please refer to After Visit Summary for other counseling recommendations.   1 week for next visit  Future Appointments  Date Time Provider Department Center  11/07/2023  2:30 PM [facility] US3 [facility]  11/14/2023  9:00 AM [facility] PROVIDER 1 [facility]  11/14/2023  9:30 AM [facility] US3 [facility]  11/21/2023  8:15 AM Warden Fillers, MD [facility] None  11/21/2023  9:00 AM [facility] PROVIDER 1 [facility]  11/21/2023  9:30 AM [facility] US3 [facility]  11/28/2023  8:35 AM Constant, Gigi Gin, MD [facility] None  12/05/2023  8:35 AM Adam Phenix, MD [facility] None  12/12/2023  8:35 AM Constant, Gigi Gin, MD [facility] None  12/16/2023  2:40 PM Tobb, Lavona Mound, DO [facility] None  12/19/2023  8:35 AM Lennart Pall, MD [facility] None  12/26/2023  8:35 AM Constant, Gigi Gin, MD [facility] None    Allie Bossier, MD

## 2023-11-07 NOTE — Progress Notes (Signed)
   Patient information  Patient Name: Bethany Pierce  Patient MRN:   [id number]  Referring practice: MFM Referring Provider: [place] [facility]  MFM CONSULT  Bethany Pierce is a 23 y.o. G1P0000 at [redacted]w[redacted]d here for ultrasound and consultation. 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 During today's visit we focused on the following concerns:   RE chronic hypertension and obesity: The patient's blood pressure is at goal today.  Due to the risk of superimposed preeclampsia after 37 to 38 weeks and consideration of delivery by 38 weeks or sooner if indicated.  Today the patient is doing well with no signs or symptoms of preeclampsia.  Pressure is at goal on Procardia.  Earlier ultrasounds also had concern for echogenic kidneys.  The kidneys are not well-visualized today due to increased maternal adiposity but they do appear normal.  A postnatal follow-up should be completed by the pediatric team to rule out any sort of kidney problem.  Sonographic findings Single intrauterine pregnancy. Fetal cardiac activity: Observed. Presentation: Cephalic. Interval fetal anatomy appears normal. Amniotic fluid: Within normal limits.  MVP: 5.04 cm. Placenta: Posterior. BPP: 8/8.   There are limitations of prenatal ultrasound such as the inability to detect certain  abnormalities due to poor visualization. Various factors such as fetal position, gestational age and maternal body habitus may increase the difficulty in visualizing the fetal anatomy.    Recommendations 1. Weekly antenatal testing until delivery 2. Growth ultrasounds every 4 weeks until delivery  3. Delivery around [redacted] weeks gestation   4.  Pediatric assessment of possible echogenic kidneys following delivery  Review of Systems: A review of systems was performed and was negative except per HPI   Vitals and Physical Exam    11/07/2023    8:53 AM 11/07/2023    8:48 AM 10/31/2023    2:24 PM  Vitals with BMI  Weight  421 lbs   Systolic 136 140 782  Diastolic 86 79 85  Pulse 93 93 89    Sitting comfortably on the sonogram table Nonlabored breathing Normal rate and rhythm Abdomen is nontender  Past pregnancies OB History  Gravida Para Term Preterm AB Living  1 0 0 0 0 0  SAB IAB Ectopic Multiple Live Births  0 0 0 0 0    # Outcome Date GA Lbr Len/2nd Weight Sex Type Anes PTL Lv  1 Current              I spent 20 minutes reviewing the patients chart, including labs and images as well as counseling the patient about her medical conditions. Greater than 50% of the time was spent in direct face-to-face patient counseling.  [name], DO Maternal fetal medicine, [place]   11/07/2023  3:15 PM

## 2023-11-08 LAB — COMPREHENSIVE METABOLIC PANEL WITH GFR
ALT: 24 IU/L (ref 0–32)
AST: 30 IU/L (ref 0–40)
Albumin: 3.5 g/dL — ABNORMAL LOW (ref 4.0–5.0)
Alkaline Phosphatase: 159 IU/L — ABNORMAL HIGH (ref 44–121)
BUN/Creatinine Ratio: 7 — ABNORMAL LOW (ref 9–23)
BUN: 5 mg/dL — ABNORMAL LOW (ref 6–20)
Bilirubin Total: 0.2 mg/dL (ref 0.0–1.2)
CO2: 19 mmol/L — ABNORMAL LOW (ref 20–29)
Calcium: 9.2 mg/dL (ref 8.7–10.2)
Chloride: 106 mmol/L (ref 96–106)
Creatinine, Ser: 0.69 mg/dL (ref 0.57–1.00)
Globulin, Total: 2.5 g/dL (ref 1.5–4.5)
Glucose: 77 mg/dL (ref 70–99)
Potassium: 4.4 mmol/L (ref 3.5–5.2)
Sodium: 139 mmol/L (ref 134–144)
Total Protein: 6 g/dL (ref 6.0–8.5)
eGFR: 124 mL/min/{1.73_m2} (ref >59)

## 2023-11-08 LAB — CBC
Hematocrit: 34.3 % (ref 34.0–46.6)
Hemoglobin: 11.3 g/dL (ref 11.1–15.9)
MCH: 30.7 pg (ref 26.6–33.0)
MCHC: 32.9 g/dL (ref 31.5–35.7)
MCV: 93 fL (ref 79–97)
Platelets: 300 10*3/uL (ref 150–450)
RBC: 3.68 x10E6/uL — ABNORMAL LOW (ref 3.77–5.28)
RDW: 12.4 % (ref 11.7–15.4)
WBC: 9.6 10*3/uL (ref 3.4–10.8)

## 2023-11-09 LAB — PROTEIN / CREATININE RATIO, URINE
Creatinine, Urine: 118.6 mg/dL
Protein, Ur: 17 mg/dL
Protein/Creat Ratio: 143 mg/g{creat} (ref 0–200)

## 2023-11-14 ENCOUNTER — Other Ambulatory Visit: Payer: Self-pay

## 2023-11-14 ENCOUNTER — Ambulatory Visit (INDEPENDENT_AMBULATORY_CARE_PROVIDER_SITE_OTHER): Admitting: Obstetrics & Gynecology

## 2023-11-14 ENCOUNTER — Other Ambulatory Visit (HOSPITAL_COMMUNITY)
Admission: RE | Admit: 2023-11-14 | Discharge: 2023-11-14 | Disposition: A | Discharge status: Home / Self Care | Source: Ambulatory Visit | Attending: Obstetrics and Gynecology | Admitting: Obstetrics and Gynecology

## 2023-11-14 ENCOUNTER — Ambulatory Visit: Payer: Medicaid Other | Attending: Obstetrics and Gynecology

## 2023-11-14 ENCOUNTER — Ambulatory Visit (HOSPITAL_BASED_OUTPATIENT_CLINIC_OR_DEPARTMENT_OTHER): Admitting: Maternal & Fetal Medicine

## 2023-11-14 VITALS — BP 143/88 | HR 110 | Wt >= 6400 oz

## 2023-11-14 DIAGNOSIS — O99213 Obesity complicating pregnancy, third trimester: Secondary | ICD-10-CM | POA: Diagnosis not present

## 2023-11-14 DIAGNOSIS — O0990 Supervision of high risk pregnancy, unspecified, unspecified trimester: Secondary | ICD-10-CM | POA: Diagnosis not present

## 2023-11-14 DIAGNOSIS — Z6841 Body Mass Index (BMI) 40.0 and over, adult: Secondary | ICD-10-CM | POA: Insufficient documentation

## 2023-11-14 DIAGNOSIS — E66813 Obesity, class 3: Secondary | ICD-10-CM | POA: Diagnosis not present

## 2023-11-14 DIAGNOSIS — E669 Obesity, unspecified: Secondary | ICD-10-CM

## 2023-11-14 DIAGNOSIS — O10919 Unspecified pre-existing hypertension complicating pregnancy, unspecified trimester: Secondary | ICD-10-CM

## 2023-11-14 DIAGNOSIS — O10013 Pre-existing essential hypertension complicating pregnancy, third trimester: Secondary | ICD-10-CM

## 2023-11-14 DIAGNOSIS — E039 Hypothyroidism, unspecified: Secondary | ICD-10-CM | POA: Diagnosis not present

## 2023-11-14 DIAGNOSIS — Z3A34 34 weeks gestation of pregnancy: Secondary | ICD-10-CM

## 2023-11-14 DIAGNOSIS — Z3A35 35 weeks gestation of pregnancy: Secondary | ICD-10-CM | POA: Insufficient documentation

## 2023-11-14 DIAGNOSIS — O35EXX0 Maternal care for other (suspected) fetal abnormality and damage, fetal genitourinary anomalies, not applicable or unspecified: Secondary | ICD-10-CM

## 2023-11-14 NOTE — Progress Notes (Signed)
   Patient information  Patient Name: Bethany Pierce  Patient MRN:   [id number]  Referring practice: MFM Referring Provider: [place] [facility]  MFM CONSULT  [name] is a 23 y.o. G1P0000 at [redacted]w[redacted]d here for ultrasound and consultation. 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  RE CHTN: Blood pressure is well controlled today w/o s/s of preeclampsia.   RE concern for possible echogenic kidneys: This was not seen on today's ultrasound but this can be difficult to appreciate this late in the pregnancy when there are poor acoustic windows.   Sonographic findings Single intrauterine pregnancy. Fetal cardiac activity: Observed. Presentation: Cephalic. Interval fetal anatomy appears normal but there has been concern for echogenic kidney Amniotic fluid: Within normal limits.  MVP: 4.52 cm. Placenta: Posterior. BPP: 8/8.   There are limitations of prenatal ultrasound such as the inability to detect certain abnormalities due to poor visualization. Various factors such as fetal position, gestational age and maternal body habitus may increase the difficulty in visualizing the fetal anatomy.    Recommendations 1. Weekly antenatal testing until delivery 2. Growth ultrasounds every 4 weeks until delivery  3.  Delivery around [redacted] weeks gestation   4. Pediatric assessment of possible echogenic kidneys following delivery  Review of Systems: A review of systems was performed and was negative except per HPI   Vitals and Physical Exam    11/14/2023    9:08 AM 11/07/2023    8:53 AM 11/07/2023    8:48 AM  Vitals with BMI  Weight   421 lbs  Systolic 135 136 578  Diastolic 86 86 79  Pulse 93 93 93    Sitting comfortably on the sonogram table Nonlabored breathing Normal rate and rhythm Abdomen is nontender  Past pregnancies OB History  Gravida Para Term Preterm AB Living  1 0 0 0 0 0  SAB IAB Ectopic Multiple Live Births  0 0 0 0 0    # Outcome Date GA Lbr Len/2nd Weight Sex Type Anes PTL Lv  1 Current             I spent 10 minutes reviewing the patients chart, including labs and images as well as counseling the patient about her medical conditions. Greater than 50% of the time was spent in direct face-to-face patient counseling.  [name]  MFM, [place]   11/14/2023  10:14 AM

## 2023-11-14 NOTE — Progress Notes (Signed)
 Pt presents for hob. Pt states that she has been experiencing headaches for the past week. Bp is elevated, 141/87- 143/88

## 2023-11-14 NOTE — Progress Notes (Signed)
   PRENATAL VISIT NOTE  Subjective:  Bethany Pierce is a 23 y.o. G1P0000 at [redacted]w[redacted]d being seen today for ongoing prenatal care.  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  Patient reports no complaints.  Contractions: Irritability. Vag. Bleeding: None.  Movement: Present. Denies leaking of fluid.   The following portions of the patient's history were reviewed and updated as appropriate: allergies, current medications, past family history, past medical history, past social history, past surgical history and problem list.   Objective:   Vitals:   11/14/23 1525 11/14/23 1532  BP: (!) 141/87 (!) 143/88  Pulse: (!) 107 (!) 110  Weight: (!) 430 lb 8 oz (195.3 kg)     Fetal Status: Fetal Heart Rate (bpm): 143   Movement: Present     General:  Alert, oriented and cooperative. Patient is in no acute distress.  Skin: Skin is warm and dry. No rash noted.   Cardiovascular: Normal heart rate noted  Respiratory: Normal respiratory effort, no problems with respiration noted  Abdomen: Soft, gravid, appropriate for gestational age.  Pain/Pressure: Absent     Pelvic: Cervical exam deferred        Extremities: Normal range of motion.  Edema: Trace  Mental Status: Normal mood and affect. Normal behavior. Normal judgment and thought content.   Assessment and Plan:  Pregnancy: G1P0000 at [redacted]w[redacted]d 1. Chronic hypertension affecting pregnancy (Primary) - procardia 60 mg  BID - her SBP today at MFM was 135 - We again reviewed s/sx of pre E. She had labs at her last visit (all normal) - weekly MFM testing - probable delivery around 35 weeks  2. Hypothyroidism, unspecified type - TSH checked and normal  3. Fetal renal anomaly, single gestation (mildly echogenic) - likely will get scan on baby postdelivery  4. Supervision of high risk pregnancy, antepartum  - Cervicovaginal ancillary only( [place]) - Culture, beta strep (group b only)  5. BMI 60.0-69.9, adult (HCC)   6. [redacted] weeks gestation of pregnancy  - Cervicovaginal ancillary only( [place]) - Culture, beta strep (group b only)  Preterm labor symptoms and general obstetric precautions including but not limited to vaginal bleeding, contractions, leaking of fluid and fetal movement were reviewed in detail with the patient. Please refer to After Visit Summary for other counseling recommendations.   Return in about 1 week (around 11/21/2023) for ROB.  Future Appointments  Date Time Provider Department Center  11/16/2023 12:15 PM Harrell Gave, NP [facility]  11/21/2023  8:15 AM Warden Fillers, MD [facility] None  11/21/2023  9:00 AM [facility] PROVIDER 1 [facility]  11/21/2023  9:30 AM [facility] US3 [facility]  11/28/2023  8:35 AM Constant, Gigi Gin, MD [facility] None  12/05/2023  8:35 AM Adam Phenix, MD [facility] None  12/12/2023  8:35 AM Constant, Gigi Gin, MD [facility] None  12/16/2023  2:40 PM Thomasene Ripple, DO [facility] None  12/19/2023  8:35 AM Lennart Pall, MD [facility] None  12/26/2023  8:35 AM Constant, Gigi Gin, MD [facility] None    Allie Bossier, MD

## 2023-11-15 LAB — CERVICOVAGINAL ANCILLARY ONLY
Chlamydia: NEGATIVE
Comment: NEGATIVE
Comment: NORMAL
Neisseria Gonorrhea: NEGATIVE

## 2023-11-16 ENCOUNTER — Ambulatory Visit (INDEPENDENT_AMBULATORY_CARE_PROVIDER_SITE_OTHER): Payer: Self-pay | Admitting: Pediatrics

## 2023-11-16 DIAGNOSIS — Z7681 Expectant parent(s) prebirth pediatrician visit: Secondary | ICD-10-CM

## 2023-11-16 NOTE — Progress Notes (Signed)
 Prenatal counseling for impending newborn done Parent agrees to vaccine and office policies [phone]

## 2023-11-17 LAB — CULTURE, BETA STREP (GROUP B ONLY): Strep Gp B Culture: POSITIVE — AB

## 2023-11-21 ENCOUNTER — Ambulatory Visit (HOSPITAL_BASED_OUTPATIENT_CLINIC_OR_DEPARTMENT_OTHER): Admitting: Maternal & Fetal Medicine

## 2023-11-21 ENCOUNTER — Ambulatory Visit: Payer: Medicaid Other | Attending: Obstetrics and Gynecology

## 2023-11-21 ENCOUNTER — Ambulatory Visit (INDEPENDENT_AMBULATORY_CARE_PROVIDER_SITE_OTHER): Admitting: Obstetrics and Gynecology

## 2023-11-21 ENCOUNTER — Other Ambulatory Visit: Payer: Self-pay

## 2023-11-21 VITALS — BP 139/87 | HR 106 | Wt >= 6400 oz

## 2023-11-21 DIAGNOSIS — E039 Hypothyroidism, unspecified: Secondary | ICD-10-CM | POA: Diagnosis not present

## 2023-11-21 DIAGNOSIS — O10013 Pre-existing essential hypertension complicating pregnancy, third trimester: Secondary | ICD-10-CM | POA: Diagnosis not present

## 2023-11-21 DIAGNOSIS — Z3A35 35 weeks gestation of pregnancy: Secondary | ICD-10-CM | POA: Diagnosis not present

## 2023-11-21 DIAGNOSIS — E669 Obesity, unspecified: Secondary | ICD-10-CM | POA: Diagnosis not present

## 2023-11-21 DIAGNOSIS — O10919 Unspecified pre-existing hypertension complicating pregnancy, unspecified trimester: Secondary | ICD-10-CM

## 2023-11-21 DIAGNOSIS — Z3A36 36 weeks gestation of pregnancy: Secondary | ICD-10-CM

## 2023-11-21 DIAGNOSIS — Z6841 Body Mass Index (BMI) 40.0 and over, adult: Secondary | ICD-10-CM | POA: Diagnosis present

## 2023-11-21 DIAGNOSIS — O99213 Obesity complicating pregnancy, third trimester: Secondary | ICD-10-CM | POA: Insufficient documentation

## 2023-11-21 DIAGNOSIS — R9389 Abnormal findings on diagnostic imaging of other specified body structures: Secondary | ICD-10-CM | POA: Insufficient documentation

## 2023-11-21 DIAGNOSIS — B009 Herpesviral infection, unspecified: Secondary | ICD-10-CM

## 2023-11-21 DIAGNOSIS — O0990 Supervision of high risk pregnancy, unspecified, unspecified trimester: Secondary | ICD-10-CM

## 2023-11-21 DIAGNOSIS — O99210 Obesity complicating pregnancy, unspecified trimester: Secondary | ICD-10-CM

## 2023-11-21 DIAGNOSIS — O98519 Other viral diseases complicating pregnancy, unspecified trimester: Secondary | ICD-10-CM

## 2023-11-21 DIAGNOSIS — O35EXX0 Maternal care for other (suspected) fetal abnormality and damage, fetal genitourinary anomalies, not applicable or unspecified: Secondary | ICD-10-CM | POA: Diagnosis not present

## 2023-11-21 NOTE — Progress Notes (Signed)
   Patient information  Patient Name: Bethany Pierce  Patient MRN:   [id number]  Referring practice: MFM Referring Provider: [facility])  MFM CONSULT  Bethany Pierce is a 23 y.o. G1P0000 at [redacted]w[redacted]d here for ultrasound and consultation. 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  RE CHTN: Patient has a long history of hypertension and today her blood pressure is 133/98 her repeat was 122/95.  These readings could be elevated due to inappropriate blood pressure cuff size but she also reports a headache and spots since this morning.  I discussed my concern for preeclampsia and the need to go to the MAU to have this ruled out.  She verbalized understanding will go directly to the IMU for assessment.  Sonographic findings Single intrauterine pregnancy. Fetal cardiac activity: Observed. Presentation: Cephalic. Interval fetal anatomy appears normal. Fetal biometry is at the 69%.  Amniotic fluid: Within normal limits.  MVP: 4.62 cm. Placenta: Posterior. BPP: 8/8.   There are limitations of prenatal ultrasound such as the inability to detect certain abnormalities due to poor visualization. Various factors such as fetal position, gestational age and maternal body habitus may increase the  difficulty in visualizing the fetal anatomy.    Recommendations - Sent to the [facility] for preeclampsia rule out.  Blood pressure assessment, lab work and fetal NST should be completed.  Analgesia for headache should be given to see if this improves.  Review of Systems: A review of systems was performed and was negative except per HPI   Vitals and Physical Exam    11/21/2023   10:34 AM 11/21/2023    9:27 AM 11/21/2023    8:12 AM  Vitals with BMI  Weight   427 lbs 5 oz  Systolic 122 133 893  Diastolic 95 98 87  Pulse 87 93 106    Sitting comfortably on the sonogram table Nonlabored breathing Normal rate and rhythm Abdomen is nontender  Past pregnancies OB History  Gravida Para Term Preterm AB Living  1 0 0 0 0 0  SAB IAB Ectopic Multiple Live Births  0 0 0 0 0    # Outcome Date GA Lbr Len/2nd Weight Sex Type Anes PTL Lv  1 Current              I spent 20 minutes reviewing the patients chart, including labs and images as well as counseling the patient about her medical conditions. Greater than 50% of the time was spent in direct face-to-face patient counseling.  Bethany Pierce  MFM, [place]   11/21/2023  1:16 PM

## 2023-11-21 NOTE — Progress Notes (Signed)
   PRENATAL VISIT NOTE  Subjective:  Bethany Pierce is a 23 y.o. G1P0000 at [redacted]w[redacted]d being seen today for ongoing prenatal care.  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  Patient doing well with no acute concerns today. She reports occasional contractions and occasional headache, occasional "palpitations." .  Contractions: Irritability. Vag. Bleeding: None.  Movement: Present. Denies leaking of fluid.   The following portions of the patient's history were reviewed and updated as appropriate: allergies, current medications, past family history, past medical history, past social history, past surgical history and problem list. Problem list updated.  Objective:   Vitals:   11/21/23 0812  BP: 139/87  Pulse: (!) 106  Weight: (!) 427 lb 4.8 oz (193.8 kg)    Fetal Status: Fetal Heart Rate (bpm): 150 Fundal Height: 38 cm Movement: Present     General:  Alert, oriented and cooperative. Patient is in no acute distress.  Skin: Skin is warm and dry. No rash noted.   Cardiovascular: Normal heart rate noted  Respiratory: Normal respiratory effort, no problems with respiration noted  Abdomen: Soft, gravid, appropriate for gestational age.  Pain/Pressure: Present     Pelvic: Cervical exam deferred        Extremities: Normal range of motion.  Edema: Trace  Mental Status:  Normal mood and affect. Normal behavior. Normal judgment and thought content.   Assessment  and Plan:  Pregnancy: G1P0000 at [redacted]w[redacted]d  1. [redacted] weeks gestation of pregnancy (Primary)   2. Chronic hypertension affecting pregnancy BP borderline, no acute s/sx of preeclampsia, pt continues with procardia and baby ASA, testing today, will schedule for IOL at 38 weeks  3. Hypothyroidism, unspecified type Continues with synthroid  4. Fetal renal anomaly, single gestation (mildly echogenic)   5. Supervision of high risk pregnancy, antepartum Continue routine prenatal care, schedule IOL  6. Severe obesity due to excess calories affecting pregnancy, antepartum (HCC)   7. 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 BMI 60.0-69.9, adult (HCC)   9. Abnormal ultrasound (possible fetal renal anomaly and lymphangioma of the neck)   Preterm labor symptoms and general obstetric precautions including but not limited to vaginal bleeding, contractions, leaking of fluid and fetal movement were reviewed in detail with the patient.  Please refer to After Visit Summary for other counseling recommendations.   Return in about 1 week (around 11/28/2023) for [facility], in person.   Avie Boeck, MD Faculty Attending Center for [facility]

## 2023-11-22 ENCOUNTER — Encounter (HOSPITAL_COMMUNITY): Payer: Self-pay

## 2023-11-23 ENCOUNTER — Encounter (HOSPITAL_COMMUNITY): Payer: Self-pay | Admitting: *Deleted

## 2023-11-23 ENCOUNTER — Telehealth (HOSPITAL_COMMUNITY): Payer: Self-pay | Admitting: *Deleted

## 2023-11-23 NOTE — Telephone Encounter (Signed)
 Preadmission screen

## 2023-11-28 ENCOUNTER — Ambulatory Visit: Admitting: Obstetrics and Gynecology

## 2023-11-28 ENCOUNTER — Encounter: Payer: Self-pay | Admitting: Obstetrics and Gynecology

## 2023-11-28 VITALS — BP 137/87 | HR 97 | Wt >= 6400 oz

## 2023-11-28 DIAGNOSIS — B009 Herpesviral infection, unspecified: Secondary | ICD-10-CM

## 2023-11-28 DIAGNOSIS — O0990 Supervision of high risk pregnancy, unspecified, unspecified trimester: Secondary | ICD-10-CM | POA: Diagnosis not present

## 2023-11-28 DIAGNOSIS — O99210 Obesity complicating pregnancy, unspecified trimester: Secondary | ICD-10-CM

## 2023-11-28 DIAGNOSIS — Z3A37 37 weeks gestation of pregnancy: Secondary | ICD-10-CM

## 2023-11-28 DIAGNOSIS — O10013 Pre-existing essential hypertension complicating pregnancy, third trimester: Secondary | ICD-10-CM | POA: Diagnosis not present

## 2023-11-28 DIAGNOSIS — O10919 Unspecified pre-existing hypertension complicating pregnancy, unspecified trimester: Secondary | ICD-10-CM

## 2023-11-28 DIAGNOSIS — E039 Hypothyroidism, unspecified: Secondary | ICD-10-CM | POA: Diagnosis not present

## 2023-11-28 DIAGNOSIS — O98519 Other viral diseases complicating pregnancy, unspecified trimester: Secondary | ICD-10-CM

## 2023-11-28 NOTE — Progress Notes (Signed)
   PRENATAL VISIT NOTE  Subjective:  Bethany Pierce is a 23 y.o. G1P0000 at 105w3d being seen today for ongoing prenatal care.  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  Patient reports no complaints.  Contractions: Not present. Vag. Bleeding: None.  Movement: Present. Denies leaking of fluid.   The following portions of the patient's history were reviewed and updated as appropriate: allergies, current medications, past family history, past medical history, past social history, past surgical history and problem list.   Objective:   Vitals:   11/28/23 0826  BP: 137/87  Pulse: 97  Weight: (!) 428 lb (194.1 kg)    Fetal Status: Fetal Heart Rate (bpm): NST   Movement: Present     General:  Alert, oriented and cooperative. Patient is in no acute distress.  Skin: Skin is warm and dry. No rash noted.   Cardiovascular: Normal heart rate noted  Respiratory: Normal respiratory effort, no problems with respiration noted  Abdomen: Soft, gravid, appropriate for gestational age.  Pain/Pressure: Absent     Pelvic: Cervical exam deferred        Extremities: Normal range of motion.  Edema: Trace  Mental Status: Normal mood and affect. Normal behavior. Normal judgment and thought content.   Assessment and Plan:  Pregnancy: G1P0000 at [redacted]w[redacted]d 1. Supervision of high risk pregnancy, antepartum (Primary) Patient is doing well without complaints  2. Chronic  hypertension affecting pregnancy BP stable on procardia  and ASA NST reviewed and reactive with baseline 130, mod variability, + accels, no decels Patient scheduled for IOL on 4/26  3. Severe obesity due to excess calories affecting pregnancy, antepartum (HCC)   4. Hypothyroidism, unspecified type Continue Synthroid   5. 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 Please refer to After Visit Summary for other counseling recommendations.   Return in about 4 weeks (around 12/26/2023) for postpartum.  Future Appointments  Date Time Provider Department Center  12/03/2023  6:45 AM [facility] SCHED ROOM [facility] None  12/05/2023  8:35 AM Tresia Fruit, MD [facility] None  12/12/2023  8:35 AM Olamide Carattini, Carles Cheadle, MD [facility] None  12/16/2023  2:40 PM Jerryl Morin, DO [facility] None  12/19/2023  8:35 AM Izell Marsh, MD [facility] None  12/26/2023  8:35 AM Roxene Alviar, Carles Cheadle, MD [facility] None    Verlyn Goad, MD

## 2023-11-28 NOTE — Progress Notes (Signed)
 Pt presents for rob. Pt has no questions or concerns at this time.

## 2023-11-28 NOTE — Addendum Note (Signed)
 Addended by: Coralyn Derry D on: 11/28/2023 10:00 AM   Modules accepted: Orders

## 2023-12-03 ENCOUNTER — Inpatient Hospital Stay (HOSPITAL_COMMUNITY)

## 2023-12-03 ENCOUNTER — Other Ambulatory Visit: Payer: Self-pay

## 2023-12-03 ENCOUNTER — Inpatient Hospital Stay (HOSPITAL_COMMUNITY)
Admission: RE | Admit: 2023-12-03 | Discharge: 2023-12-08 | DRG: 787 | Disposition: A | Discharge status: Home / Self Care | Payer: Medicaid Other | Attending: Obstetrics and Gynecology | Admitting: Obstetrics and Gynecology

## 2023-12-03 ENCOUNTER — Encounter (HOSPITAL_COMMUNITY): Payer: Self-pay | Admitting: Obstetrics and Gynecology

## 2023-12-03 DIAGNOSIS — O99344 Other mental disorders complicating childbirth: Secondary | ICD-10-CM | POA: Diagnosis present

## 2023-12-03 DIAGNOSIS — O9962 Diseases of the digestive system complicating childbirth: Secondary | ICD-10-CM | POA: Diagnosis present

## 2023-12-03 DIAGNOSIS — O35EXX0 Maternal care for other (suspected) fetal abnormality and damage, fetal genitourinary anomalies, not applicable or unspecified: Secondary | ICD-10-CM | POA: Diagnosis present

## 2023-12-03 DIAGNOSIS — Z8249 Family history of ischemic heart disease and other diseases of the circulatory system: Secondary | ICD-10-CM | POA: Diagnosis not present

## 2023-12-03 DIAGNOSIS — E039 Hypothyroidism, unspecified: Secondary | ICD-10-CM | POA: Diagnosis present

## 2023-12-03 DIAGNOSIS — O9902 Anemia complicating childbirth: Secondary | ICD-10-CM | POA: Diagnosis present

## 2023-12-03 DIAGNOSIS — O99214 Obesity complicating childbirth: Secondary | ICD-10-CM | POA: Diagnosis present

## 2023-12-03 DIAGNOSIS — O1092 Unspecified pre-existing hypertension complicating childbirth: Secondary | ICD-10-CM | POA: Diagnosis present

## 2023-12-03 DIAGNOSIS — O1002 Pre-existing essential hypertension complicating childbirth: Secondary | ICD-10-CM | POA: Diagnosis not present

## 2023-12-03 DIAGNOSIS — K219 Gastro-esophageal reflux disease without esophagitis: Secondary | ICD-10-CM | POA: Diagnosis present

## 2023-12-03 DIAGNOSIS — O99824 Streptococcus B carrier state complicating childbirth: Secondary | ICD-10-CM | POA: Diagnosis present

## 2023-12-03 DIAGNOSIS — Z3A38 38 weeks gestation of pregnancy: Secondary | ICD-10-CM | POA: Diagnosis not present

## 2023-12-03 DIAGNOSIS — Z7982 Long term (current) use of aspirin: Secondary | ICD-10-CM

## 2023-12-03 DIAGNOSIS — F419 Anxiety disorder, unspecified: Secondary | ICD-10-CM | POA: Diagnosis present

## 2023-12-03 DIAGNOSIS — Z833 Family history of diabetes mellitus: Secondary | ICD-10-CM | POA: Diagnosis not present

## 2023-12-03 DIAGNOSIS — O10913 Unspecified pre-existing hypertension complicating pregnancy, third trimester: Principal | ICD-10-CM | POA: Diagnosis present

## 2023-12-03 DIAGNOSIS — O99284 Endocrine, nutritional and metabolic diseases complicating childbirth: Secondary | ICD-10-CM | POA: Diagnosis present

## 2023-12-03 DIAGNOSIS — O9832 Other infections with a predominantly sexual mode of transmission complicating childbirth: Secondary | ICD-10-CM | POA: Diagnosis present

## 2023-12-03 DIAGNOSIS — Z79890 Hormone replacement therapy (postmenopausal): Secondary | ICD-10-CM

## 2023-12-03 DIAGNOSIS — O339 Maternal care for disproportion, unspecified: Secondary | ICD-10-CM | POA: Diagnosis present

## 2023-12-03 DIAGNOSIS — Z6841 Body Mass Index (BMI) 40.0 and over, adult: Secondary | ICD-10-CM

## 2023-12-03 DIAGNOSIS — B951 Streptococcus, group B, as the cause of diseases classified elsewhere: Secondary | ICD-10-CM | POA: Diagnosis present

## 2023-12-03 DIAGNOSIS — A6000 Herpesviral infection of urogenital system, unspecified: Secondary | ICD-10-CM | POA: Diagnosis present

## 2023-12-03 DIAGNOSIS — O10919 Unspecified pre-existing hypertension complicating pregnancy, unspecified trimester: Secondary | ICD-10-CM

## 2023-12-03 DIAGNOSIS — A749 Chlamydial infection, unspecified: Secondary | ICD-10-CM | POA: Diagnosis not present

## 2023-12-03 DIAGNOSIS — O99820 Streptococcus B carrier state complicating pregnancy: Secondary | ICD-10-CM | POA: Diagnosis not present

## 2023-12-03 LAB — CBC
HCT: 32.3 % — ABNORMAL LOW (ref 36.0–46.0)
Hemoglobin: 10.8 g/dL — ABNORMAL LOW (ref 12.0–15.0)
MCH: 31 pg (ref 26.0–34.0)
MCHC: 33.4 g/dL (ref 30.0–36.0)
MCV: 92.8 fL (ref 80.0–100.0)
Platelets: 288 10*3/uL (ref 150–400)
RBC: 3.48 MIL/uL — ABNORMAL LOW (ref 3.87–5.11)
RDW: 13.7 % (ref 11.5–15.5)
WBC: 10.7 10*3/uL — ABNORMAL HIGH (ref 4.0–10.5)
nRBC: 0 % (ref 0.0–0.2)

## 2023-12-03 LAB — COMPREHENSIVE METABOLIC PANEL WITH GFR
ALT: 21 U/L (ref 0–44)
AST: 24 U/L (ref 15–41)
Albumin: 2.6 g/dL — ABNORMAL LOW (ref 3.5–5.0)
Alkaline Phosphatase: 115 U/L (ref 38–126)
Anion gap: 9 (ref 5–15)
BUN: 6 mg/dL (ref 6–20)
CO2: 20 mmol/L — ABNORMAL LOW (ref 22–32)
Calcium: 9 mg/dL (ref 8.9–10.3)
Chloride: 109 mmol/L (ref 98–111)
Creatinine, Ser: 0.64 mg/dL (ref 0.44–1.00)
GFR, Estimated: >60 mL/min (ref >60)
Glucose, Bld: 76 mg/dL (ref 70–99)
Potassium: 3.6 mmol/L (ref 3.5–5.1)
Sodium: 138 mmol/L (ref 135–145)
Total Bilirubin: 0.4 mg/dL (ref 0.0–1.2)
Total Protein: 6.3 g/dL — ABNORMAL LOW (ref 6.5–8.1)

## 2023-12-03 LAB — TYPE AND SCREEN
ABO/RH(D): O POS
Antibody Screen: NEGATIVE

## 2023-12-03 MED ORDER — FENTANYL CITRATE (PF) 100 MCG/2ML IJ SOLN
50.0000 ug | INTRAMUSCULAR | Status: DC | PRN
  Administered 2023-12-03: 100 ug via INTRAVENOUS
  Administered 2023-12-03: 50 ug via INTRAVENOUS
  Administered 2023-12-03 – 2023-12-04 (×11): 100 ug via INTRAVENOUS
  Filled 2023-12-03 (×13): qty 2

## 2023-12-03 MED ORDER — SODIUM CHLORIDE 0.9 % IV SOLN
5.0000 10*6.[IU] | Freq: Once | INTRAVENOUS | Status: AC
  Administered 2023-12-03: 5 10*6.[IU] via INTRAVENOUS
  Filled 2023-12-03: qty 5

## 2023-12-03 MED ORDER — LACTATED RINGERS IV SOLN: INTRAVENOUS | Status: AC

## 2023-12-03 MED ORDER — SOD CITRATE-CITRIC ACID 500-334 MG/5ML PO SOLN
30.0000 mL | ORAL | Status: DC | PRN
  Filled 2023-12-03: qty 30

## 2023-12-03 MED ORDER — TERBUTALINE SULFATE 1 MG/ML IJ SOLN: 0.2500 mg | Freq: Once | INTRAMUSCULAR | Status: DC | PRN

## 2023-12-03 MED ORDER — LEVOTHYROXINE SODIUM 50 MCG PO TABS
50.0000 ug | ORAL_TABLET | Freq: Every day | ORAL | Status: DC
  Administered 2023-12-04 – 2023-12-08 (×5): 50 ug via ORAL
  Filled 2023-12-03 (×6): qty 1

## 2023-12-03 MED ORDER — MISOPROSTOL 50MCG HALF TABLET
50.0000 ug | ORAL_TABLET | ORAL | Status: DC | PRN
  Administered 2023-12-03: 50 ug via BUCCAL
  Filled 2023-12-03: qty 1

## 2023-12-03 MED ORDER — LIDOCAINE HCL (PF) 1 % IJ SOLN: 30.0000 mL | INTRAMUSCULAR | Status: DC | PRN

## 2023-12-03 MED ORDER — ONDANSETRON HCL 4 MG/2ML IJ SOLN
4.0000 mg | Freq: Four times a day (QID) | INTRAMUSCULAR | Status: DC | PRN
  Administered 2023-12-04 – 2023-12-05 (×3): 4 mg via INTRAVENOUS
  Filled 2023-12-03 (×3): qty 2

## 2023-12-03 MED ORDER — ACETAMINOPHEN 325 MG PO TABS
650.0000 mg | ORAL_TABLET | ORAL | Status: DC | PRN
  Administered 2023-12-03: 650 mg via ORAL
  Filled 2023-12-03: qty 2

## 2023-12-03 MED ORDER — MISOPROSTOL 50MCG HALF TABLET
50.0000 ug | ORAL_TABLET | Freq: Once | ORAL | Status: AC
Start: 2023-12-03 — End: 2023-12-03
  Administered 2023-12-03: 50 ug via ORAL
  Filled 2023-12-03: qty 1

## 2023-12-03 MED ORDER — NIFEDIPINE ER OSMOTIC RELEASE 30 MG PO TB24
60.0000 mg | ORAL_TABLET | Freq: Every day | ORAL | Status: DC
Start: 2023-12-04 — End: 2023-12-05
  Administered 2023-12-04 – 2023-12-05 (×2): 60 mg via ORAL
  Filled 2023-12-03 (×3): qty 2

## 2023-12-03 MED ORDER — OXYTOCIN BOLUS FROM INFUSION: 333.0000 mL | Freq: Once | INTRAVENOUS | Status: DC

## 2023-12-03 MED ORDER — OXYTOCIN-SODIUM CHLORIDE 30-0.9 UT/500ML-% IV SOLN
1.0000 m[IU]/min | INTRAVENOUS | Status: DC
  Administered 2023-12-04 – 2023-12-05 (×2): 2 m[IU]/min via INTRAVENOUS
  Filled 2023-12-03: qty 500

## 2023-12-03 MED ORDER — OXYTOCIN-SODIUM CHLORIDE 30-0.9 UT/500ML-% IV SOLN: 2.5000 [IU]/h | INTRAVENOUS | Status: DC

## 2023-12-03 MED ORDER — LACTATED RINGERS IV SOLN: 500.0000 mL | INTRAVENOUS | Status: AC | PRN

## 2023-12-03 MED ORDER — MISOPROSTOL 25 MCG QUARTER TABLET: 25.0000 ug | ORAL_TABLET | Freq: Once | ORAL | Status: DC

## 2023-12-03 MED ORDER — OXYCODONE-ACETAMINOPHEN 5-325 MG PO TABS: 1.0000 | ORAL_TABLET | ORAL | Status: DC | PRN

## 2023-12-03 MED ORDER — PENICILLIN G POT IN DEXTROSE 60000 UNIT/ML IV SOLN
3.0000 10*6.[IU] | INTRAVENOUS | Status: DC
  Administered 2023-12-03 – 2023-12-05 (×12): 3 10*6.[IU] via INTRAVENOUS
  Filled 2023-12-03 (×12): qty 50

## 2023-12-03 NOTE — Progress Notes (Signed)
 Labor Progress Note Bethany Pierce is a 22 y.o. G1P0000 at [redacted]w[redacted]d presented for IOL due to cHTN  S: In to meet patient. Comfortable w family at bedside, no concerns at this time.   O:  BP (!) 142/66   Pulse 95   Temp 98.1 F (36.7 C) (Oral)   Ht 5\' 6"  (1.676 m)   Wt (!) 195.2 kg   LMP 03/11/2023   SpO2 98%   BMI 69.47 kg/m  EFM: 125/mod/+a/-d  CVE: Dilation: 3 Effacement (%): 50 Cervical Position: Posterior Station: -3 Presentation: Vertex (confirmed by bedside ultrasound) Exam by:: Adline Hook, RN   A&P: 23 y.o. G1P0000 [redacted]w[redacted]d here for IOL for cHTN #Labor: S/p FB, cx still thick so repeat dose of cytotec given. Discussed plan for AROM +/- Pitocin at next check #Pain: Desires epidural #FWB: Cat I, has been difficult to trace, so intermittent monitoring for now to encourage movement #GBS positive, on PCN  #cHTN: Some mild range BP on most recent BP  labs on admit wnl  continue Procardia  60mg  qd  #BMI 69  #HSV: On Valtrex   #Fetal renal anomaly: Mild echogenicity, will inform peds at delivery  Melanie Spires, MD 11:56 PM

## 2023-12-03 NOTE — H&P (Addendum)
 OBSTETRIC ADMISSION HISTORY AND PHYSICAL  Bethany Pierce is a 23 y.o. female G1P0000 with IUP at [redacted]w[redacted]d by 10 week US  presenting for IOL for poorly controlled CHTN on Procardia . Mild headache on admission, not tried anything for it. She reports +FMs, No LOF, no VB, no blurry vision, headaches or peripheral edema, and RUQ pain.  She plans on formula feeding. She request POPs for birth control. She received her prenatal care at  [facility]    Dating: By 10 week US  --->  Estimated Date of Delivery: 12/16/23  Sono:   @[redacted]w[redacted]d , CWD, normal female anatomy, cephalic presentation, posterior placental lie,2961 gm 6 lb 8 oz 69 % EFW   Prenatal History/Complications:  - Poorly controlled CHTN on 60mg  Procardia  BID - BMI 60-69 - HSV on Valtrex , no recent outbreaks; last outbreak prior to pregnancy - Hypothyroidism on synthroid ; after TSH 4.730 (3/3) - Vitamin D deficiency - Fetal renal anomaly, mildly echogenic  Past Medical History: Past Medical History:  Diagnosis Date   HSV (herpes simplex virus) anogenital infection    Hypertension    Hypothyroidism    Medical history non-contributory     Past Surgical History: Past Surgical History:  Procedure Laterality Date   NO PAST SURGERIES     WISDOM TOOTH EXTRACTION Bilateral     Obstetrical History: OB History     Gravida  1   Para  0   Term  0   Preterm  0   AB  0   Living  0      SAB  0   IAB  0   Ectopic  0   Multiple  0   Live Births  0           Social History Social History   Socioeconomic History   Marital status: Single    Spouse name: Not on file   Number of children: Not on file   Years of education: Not on file   Highest education level: Not on file  Occupational History   Not on file  Tobacco Use   Smoking status: Never    Passive exposure: Past   Smokeless tobacco: Never  Vaping Use   Vaping status: Never Used  Substance and Sexual Activity   Alcohol use: Not Currently    Comment: occ   Drug  use: Never   Sexual activity: Yes    Partners: Male    Birth control/protection: Condom    Comment: menarche 23yo, sexual debut 23yo  Other Topics Concern   Not on file  Social History Narrative   Not on file   Social Drivers of Health   Financial Resource Strain: Not on file  Food Insecurity: No Food Insecurity (12/03/2023)   Hunger Vital Sign    Worried About Running Out of Food in the Last Year: Never true    Ran Out of Food in the Last Year: Never true  Transportation Needs: No Transportation Needs (12/03/2023)   PRAPARE - Administrator, Civil Service (Medical): No    Lack of Transportation (Non-Medical): No  Physical Activity: Not on file  Stress: Not on file  Social Connections: Socially Isolated (12/03/2023)   Social Connection and Isolation Panel [NHANES]    Frequency of Communication with Friends and Family: Three times a week    Frequency of Social Gatherings with Friends and Family: Three times a week    Attends Religious Services: Never    Active Member of Clubs or Organizations: No  Attends Banker Meetings: Never    Marital Status: Never married    Family History: Family History  Problem Relation Age of Onset   Diabetes Mother    Hypertension Father    Asthma Brother    Asthma Maternal Grandmother    Diabetes Maternal Grandmother    Hypertension Paternal Grandmother    Cancer Neg Hx    Heart disease Neg Hx     Allergies: No Known Allergies  Medications Prior to Admission  Medication Sig Dispense Refill Last Dose/Taking   acetaminophen (TYLENOL) 500 MG tablet Take 500 mg by mouth every 6 (six) hours as needed for moderate pain.   12/02/2023 at  5:00 PM   aspirin  EC 81 MG tablet Take 1 tablet (81 mg total) by mouth daily. Swallow whole. 90 tablet 2 12/02/2023 at  8:00 AM   levothyroxine  (SYNTHROID ) 50 MCG tablet Take 1 tablet (50 mcg total) by mouth daily before breakfast. 60 tablet 4 12/01/2023 at  8:00 AM   NIFEdipine  (PROCARDIA   XL/NIFEDICAL XL) 60 MG 24 hr tablet Take 1 tablet (60 mg total) by mouth in the morning and at bedtime. 60 tablet 5 12/03/2023 at 10:00 AM   Prenatal Vit-Fe Fumarate-FA (PRENATAL VITAMIN PO) Take 1 tablet by mouth daily.   12/03/2023 at 10:00 AM   valACYclovir  (VALTREX ) 1000 MG tablet Take one tablet by mouth daily 30 tablet 5 12/03/2023 at 10:00 AM   Blood Pressure Monitoring (OMRON 3 SERIES BP MONITOR) DEVI Use to check blood pressure daily 1 each 0      Review of Systems   All systems reviewed and negative except as stated in HPI  Blood pressure 127/81, pulse (!) 102, height 5\' 6"  (1.676 m), weight (!) 195.2 kg, last menstrual period 03/11/2023, SpO2 98%. General appearance: alert, cooperative, appears stated age, and no distress Lungs: clear to auscultation bilaterally Heart: regular rate and rhythm Abdomen: soft, non-tender; bowel sounds normal Pelvic: adequate, unproven Extremities: Homans sign is negative, no sign of DVT DTR's 2+ Presentation: cephalic Fetal monitoringBaseline: 120 bpm, Variability: Good {> 6 bpm), Accelerations: Reactive, and Decelerations: Absent Uterine activityNone Dilation: 1 Effacement (%): 50 Station: -3   Prenatal labs: ABO, Rh: O/Positive/-- (10/21 1005) Antibody: Negative (10/21 1005) Rubella: 2.52 (10/21 1005) RPR: Non Reactive (02/19 0813)  HBsAg: Negative (10/21 1005)  HIV: Non Reactive (02/19 0813)  GBS: Positive/-- (04/07 1552)    Lab Results  Component Value Date   GBS Positive (A) 11/14/2023   GTT normal Genetic screening  low risk female, negative Anatomy US  echogenic kidney, otherwise normal  Immunization History  Administered Date(s) Administered   Influenza, Seasonal, Injecte, Preservative Fre 06/06/2023   Tdap 09/26/2023    Prenatal Transfer Tool  Maternal Diabetes: No Genetic Screening: Normal Maternal Ultrasounds/Referrals: Fetal Kidney Anomalies Fetal Ultrasounds or other Referrals:  None Maternal Substance Abuse:   No Significant Maternal Medications:  Meds include: Syntroid Significant Maternal Lab Results: Group B Strep positive Number of Prenatal Visits:greater than 3 verified prenatal visits Maternal Vaccinations:TDap and Flu Other Comments:  None   No results found for this or any previous visit (from the past 24 hours).  Patient Active Problem List   Diagnosis Date Noted   Chronic hypertension in obstetric context in third trimester 12/03/2023   Fetal renal anomaly, single gestation (mildly echogenic) 11/07/2023   Acanthosis nigricans 10/27/2023   Vitamin D deficiency 10/27/2023   Hypothyroidism 10/11/2023   Excessive weight gain during pregnancy in third trimester 10/10/2023   Herpes virus  infection in mother during pregnancy, antepartum 09/26/2023   Abnormal ultrasound (possible fetal renal anomaly and lymphangioma of the neck) 08/09/2023   Chronic hypertension affecting pregnancy 08/01/2023   Obesity affecting pregnancy, antepartum 06/06/2023   BMI 60.0-69.9, adult (HCC) 06/06/2023   Supervision of high risk pregnancy, antepartum 05/30/2023    Assessment/Plan:  Bethany Pierce is a 23 y.o. G1P0000 at [redacted]w[redacted]d here for IOL for [facility]  #Labor:Cytotec > Foley balloon > AROM > Pitocin as indicated by SVE. FB placed with 40cc. Will start with buccal Cytotec. Reassess in 4 hours or when balloon falls out.  #Pain: Pt aware of options; per pt request #FWB: Cat I #GBS status:  Positive; PCN #Feeding: Formula #Reproductive Life planning: Progesterone only pills #Circ:  yes  Darrow End, MD  12/03/2023, 3:21 PM  I provided general supervision for this patient and was immediately available for any patient care concerns.

## 2023-12-04 ENCOUNTER — Inpatient Hospital Stay (HOSPITAL_COMMUNITY): Payer: Self-pay | Admitting: Anesthesiology

## 2023-12-04 DIAGNOSIS — B951 Streptococcus, group B, as the cause of diseases classified elsewhere: Secondary | ICD-10-CM | POA: Diagnosis present

## 2023-12-04 LAB — PROTEIN / CREATININE RATIO, URINE
Creatinine, Urine: 75 mg/dL
Total Protein, Urine: <6 mg/dL

## 2023-12-04 LAB — CBC
HCT: 34.7 % — ABNORMAL LOW (ref 36.0–46.0)
Hemoglobin: 11.3 g/dL — ABNORMAL LOW (ref 12.0–15.0)
MCH: 30.5 pg (ref 26.0–34.0)
MCHC: 32.6 g/dL (ref 30.0–36.0)
MCV: 93.5 fL (ref 80.0–100.0)
Platelets: 295 10*3/uL (ref 150–400)
RBC: 3.71 MIL/uL — ABNORMAL LOW (ref 3.87–5.11)
RDW: 13.3 % (ref 11.5–15.5)
WBC: 12.2 10*3/uL — ABNORMAL HIGH (ref 4.0–10.5)
nRBC: 0 % (ref 0.0–0.2)

## 2023-12-04 LAB — RPR: RPR Ser Ql: NONREACTIVE

## 2023-12-04 MED ORDER — EPHEDRINE 5 MG/ML INJ: 10.0000 mg | INTRAVENOUS | Status: DC | PRN

## 2023-12-04 MED ORDER — PHENYLEPHRINE 80 MCG/ML (10ML) SYRINGE FOR IV PUSH (FOR BLOOD PRESSURE SUPPORT): 80.0000 ug | PREFILLED_SYRINGE | INTRAVENOUS | Status: DC | PRN

## 2023-12-04 MED ORDER — LACTATED RINGERS IV SOLN
500.0000 mL | Freq: Once | INTRAVENOUS | Status: DC
Start: 2023-12-04 — End: 2023-12-04

## 2023-12-04 MED ORDER — LACTATED RINGERS IV SOLN: 500.0000 mL | INTRAVENOUS | Status: DC | PRN

## 2023-12-04 MED ORDER — LIDOCAINE HCL (PF) 1 % IJ SOLN
INTRAMUSCULAR | Status: DC | PRN
  Administered 2023-12-04: 5 mL via EPIDURAL
  Administered 2023-12-04: 3 mL via EPIDURAL
  Administered 2023-12-04: 2 mL via EPIDURAL

## 2023-12-04 MED ORDER — DIPHENHYDRAMINE HCL 50 MG/ML IJ SOLN
12.5000 mg | INTRAMUSCULAR | Status: DC | PRN
  Administered 2023-12-04 (×2): 12.5 mg via INTRAVENOUS
  Filled 2023-12-04: qty 1

## 2023-12-04 MED ORDER — FENTANYL-BUPIVACAINE-NACL 0.5-0.125-0.9 MG/250ML-% EP SOLN
12.0000 mL/h | EPIDURAL | Status: DC | PRN
  Administered 2023-12-04: 10 mL/h via EPIDURAL
  Administered 2023-12-05: 12 mL/h via EPIDURAL
  Filled 2023-12-04 (×2): qty 250

## 2023-12-04 MED ORDER — LACTATED RINGERS IV SOLN: INTRAVENOUS | Status: DC

## 2023-12-04 MED ORDER — EPHEDRINE 5 MG/ML INJ
10.0000 mg | INTRAVENOUS | Status: DC | PRN
Start: 2023-12-04 — End: 2023-12-04

## 2023-12-04 MED ORDER — DIPHENHYDRAMINE HCL 50 MG/ML IJ SOLN
25.0000 mg | INTRAMUSCULAR | Status: DC | PRN
  Administered 2023-12-04 – 2023-12-05 (×2): 25 mg via INTRAVENOUS
  Filled 2023-12-04: qty 1

## 2023-12-04 MED ORDER — LACTATED RINGERS IV SOLN: 500.0000 mL | Freq: Once | INTRAVENOUS | Status: DC

## 2023-12-04 NOTE — Progress Notes (Signed)
 Patient ID: Bethany Pierce, female   DOB: 2001/03/18, 23 y.o.   MRN: [id number]  S/p multiple doses of Fentanyl this shift (5); last one given just prior to exam to help with comfort during exam/AROM  BPs 156/98, 121/75 FHR 120s, +accels, no decels Ctx irreg 2-5 mins with Pit @ 35mu/min Cx 4/60/vtx -2; AROM for clear fluid  IUP@38 .2wks IOL process cHTN  Hopeful that with Pitocin and now AROM that she will get into active labor  Jolayne Natter [facility] 12/04/2023 4:37 PM

## 2023-12-04 NOTE — Progress Notes (Addendum)
 RN at [facility] to discuss further FHR and CTX evaluation management.  EFM and CTX difficult to trace.  Monitors adjusted several times.  EFM only obtained in left lateral position.  Pt has been unable to remain in that position due to pian making EFM difficult.    Discussed risks of pitocin admin and difficulty of tracing ctx and efm.  Pt verbalizes understanding.  Discussed options of remaining in left lateral position to facilitate tracing vs AROM and full internal monitors vs. D/c of pitocin.  Pt previously declined AROM.  Discussed holding pitocin or d/c if EFM and ctx pattern continue to be untraceable.  Pt agreeable to plan, unsure about AROM at this time.  Agreeable to remaining in left lateral position.

## 2023-12-04 NOTE — Progress Notes (Signed)
 Labor Progress Note Bethany Pierce is a 22 y.o. G1P0000 at [redacted]w[redacted]d presented for IOL due to [place]  S: Increasingly uncomfortable w ctx, having to breathe through  O:  BP 128/85   Pulse 68   Temp 97.9 F (36.6 C) (Oral)   Resp 17   Ht 5\' 6"  (1.676 m)   Wt (!) 195.2 kg   LMP 03/11/2023   SpO2 100%   BMI 69.47 kg/m  EFM: 125/mod/+a/-d  CVE: Dilation: 4 Effacement (%): 60 Cervical Position: Posterior Station: -2 Presentation: Vertex Exam by:: Bernetta Brilliant MD   A&P: 23 y.o. G1P0000 [redacted]w[redacted]d here for IOL for cHTN  #Labor: Making progress. S/p AROM and on Pitocin, will cont to uptitrate.  #Pain: Epidural #FWB: Cat I #GBS positive, on PCN  #cHTN: Normotensive all day  labs on admit wnl  continue Procardia  60mg  qd  #BMI 69  #HSV: On Valtrex   #Fetal renal anomaly: Mild echogenicity, will inform peds at delivery  Melanie Spires, MD 10:20 PM

## 2023-12-04 NOTE — Anesthesia Preprocedure Evaluation (Addendum)
 Anesthesia Evaluation  Patient identified by MRN, date of birth, ID band Patient awake    Reviewed: Allergy & Precautions, NPO status , Patient's Chart, lab work & pertinent test results  Airway Mallampati: IV  TM Distance: >3 FB Neck ROM: Full    Dental  (+) Teeth Intact, Dental Advisory Given   Pulmonary neg pulmonary ROS   Pulmonary exam normal breath sounds clear to auscultation       Cardiovascular hypertension, Pt. on medications Normal cardiovascular exam Rhythm:Regular Rate:Normal     Neuro/Psych negative neurological ROS  negative psych ROS   GI/Hepatic ,GERD  ,,  Endo/Other  Hypothyroidism  Class 4 obesity (BMI 69)  Renal/GU Renal disease  negative genitourinary   Musculoskeletal negative musculoskeletal ROS (+)    Abdominal  (+) + obese  Peds  Hematology  (+) Blood dyscrasia, anemia Plt 295k   Anesthesia Other Findings   Reproductive/Obstetrics (+) Pregnancy HSV                             Anesthesia Physical Anesthesia Plan  ASA: 4  Anesthesia Plan: Epidural   Post-op Pain Management: Minimal or no pain anticipated   Induction:   PONV Risk Score and Plan: 2 and Treatment may vary due to age or medical condition  Airway Management Planned: Natural Airway  Additional Equipment: Fetal Monitoring and None  Intra-op Plan:   Post-operative Plan:   Informed Consent: I have reviewed the patients History and Physical, chart, labs and discussed the procedure including the risks, benefits and alternatives for the proposed anesthesia with the patient or authorized representative who has indicated his/her understanding and acceptance.     Dental advisory given  Plan Discussed with: Anesthesiologist  Anesthesia Plan Comments: (Patient identified. Risks/Benefits/Options discussed with patient including but not limited to bleeding, infection, nerve damage, paralysis,  failed block, incomplete pain control, headache, blood pressure changes, nausea, vomiting, reactions to medication both or allergic, itching and postpartum back pain. Confirmed with bedside nurse the patient's most recent platelet count. Confirmed with patient that they are not currently taking any anticoagulation, have any bleeding history or any family history of bleeding disorders. Patient expressed understanding and wished to proceed. All questions were answered. )        Anesthesia Quick Evaluation

## 2023-12-04 NOTE — Progress Notes (Signed)
 Labor Progress Note Bethany Pierce is a 23 y.o. G1P0000 at [redacted]w[redacted]d presented for IOL due to [place]  S: Feeling more ctx, but not very strong  O:  BP 138/78   Pulse 84   Temp 98.1 F (36.7 C) (Oral)   Ht 5\' 6"  (1.676 m)   Wt (!) 195.2 kg   LMP 03/11/2023   SpO2 98%   BMI 69.47 kg/m  EFM: 130/mod/+a/-d  CVE: Dilation: 3.5 Effacement (%): 50 Cervical Position: Posterior Station: -3 Presentation: Vertex Exam by:: Adline Hook, RN   A&P: 23 y.o. G1P0000 [redacted]w[redacted]d here for IOL for cHTN  #Labor: Making cervical change, offered AROM, but pt preference to start Pitocin first -- will start 2x2 #Pain: Desires epidural #FWB: Cat I #GBS positive, on PCN  #cHTN: Some mild range BP on most recent BP  labs on admit wnl  continue Procardia  60mg  qd  #BMI 69  #HSV: On Valtrex   #Fetal renal anomaly: Mild echogenicity, will inform peds at delivery  Melanie Spires, MD 3:17 AM

## 2023-12-04 NOTE — Anesthesia Procedure Notes (Signed)
 Epidural Patient location during procedure: OB Start time: 12/04/2023 6:42 PM End time: 12/04/2023 6:52 PM  Staffing Anesthesiologist: Erin Havers, MD Performed: anesthesiologist   Preanesthetic Checklist Completed: patient identified, IV checked, risks and benefits discussed, monitors and equipment checked, pre-op evaluation and timeout performed  Epidural Patient position: sitting Prep: DuraPrep Patient monitoring: blood pressure and continuous pulse ox Approach: midline Location: L3-L4 Injection technique: LOR air  Needle:  Needle type: Tuohy  Needle gauge: 17 G Needle length: 9 cm Needle insertion depth: 10 cm Catheter size: 19 Gauge Catheter at skin depth: 16 cm Test dose: negative and Other (1% Lidocaine )  Additional Notes Patient identified.  Risk benefits discussed including failed block, incomplete pain control, headache, nerve damage, paralysis, blood pressure changes, nausea, vomiting, reactions to medication both toxic or allergic, and postpartum back pain.  Patient expressed understanding and wished to proceed.  All questions were answered.  Sterile technique used throughout procedure and epidural site dressed with sterile barrier dressing. No paresthesia or other complications noted. The patient did not experience any signs of intravascular injection such as tinnitus or metallic taste in mouth nor signs of intrathecal spread such as rapid motor block. Please see nursing notes for vital signs. Reason for block:procedure for pain

## 2023-12-04 NOTE — Progress Notes (Signed)
 difficult to trace ctx due to position and by habitus, TOCO adjusted several times

## 2023-12-04 NOTE — Progress Notes (Signed)
 difficult to trace ctx due to position and by habitus, TOCO adjusted.

## 2023-12-04 NOTE — Progress Notes (Signed)
 Patient ID: Bethany Pierce, female   DOB: [birth year], 23 y.o.   MRN: [id number]  In earlier to meet patient ~0920; s/p cyto & FB; Pit started at 0300; feeling some ctx now and agreeable with AROM  BPs 111/74, 135/90 FHR 120s, +accels, no decels, Cat 1 Ctx- unable to trace Cx post/3+/60/unable to assess station of vtx due to pt's discomfort  IUP@38 .2wks cHTN-on Procardia  XL 60mg  IOL process  Discussion with nursing about increasing Pitocin when able; need cx to come more anterior in order to attempt AROM; pt also open to trying Fentanyl prior to next exam in order to make it less uncomfortable  Jolayne Natter CNM 12/04/2023 12:19 PM

## 2023-12-05 ENCOUNTER — Other Ambulatory Visit: Payer: Self-pay

## 2023-12-05 ENCOUNTER — Encounter: Admitting: Obstetrics & Gynecology

## 2023-12-05 ENCOUNTER — Encounter (HOSPITAL_COMMUNITY): Payer: Self-pay | Admitting: Obstetrics and Gynecology

## 2023-12-05 ENCOUNTER — Encounter (HOSPITAL_COMMUNITY)
Admission: RE | Disposition: A | Discharge status: Home / Self Care | Payer: Self-pay | Source: Home / Self Care | Attending: Obstetrics and Gynecology

## 2023-12-05 DIAGNOSIS — Z3A38 38 weeks gestation of pregnancy: Secondary | ICD-10-CM

## 2023-12-05 DIAGNOSIS — A749 Chlamydial infection, unspecified: Secondary | ICD-10-CM | POA: Diagnosis not present

## 2023-12-05 DIAGNOSIS — O339 Maternal care for disproportion, unspecified: Secondary | ICD-10-CM | POA: Diagnosis present

## 2023-12-05 DIAGNOSIS — O99820 Streptococcus B carrier state complicating pregnancy: Secondary | ICD-10-CM | POA: Diagnosis not present

## 2023-12-05 DIAGNOSIS — O1002 Pre-existing essential hypertension complicating childbirth: Secondary | ICD-10-CM | POA: Diagnosis not present

## 2023-12-05 DIAGNOSIS — O9832 Other infections with a predominantly sexual mode of transmission complicating childbirth: Secondary | ICD-10-CM | POA: Diagnosis not present

## 2023-12-05 DIAGNOSIS — O99284 Endocrine, nutritional and metabolic diseases complicating childbirth: Secondary | ICD-10-CM | POA: Diagnosis not present

## 2023-12-05 LAB — CBC
HCT: 31.6 % — ABNORMAL LOW (ref 36.0–46.0)
Hemoglobin: 10.4 g/dL — ABNORMAL LOW (ref 12.0–15.0)
MCH: 30.7 pg (ref 26.0–34.0)
MCHC: 32.9 g/dL (ref 30.0–36.0)
MCV: 93.2 fL (ref 80.0–100.0)
Platelets: 243 10*3/uL (ref 150–400)
RBC: 3.39 MIL/uL — ABNORMAL LOW (ref 3.87–5.11)
RDW: 13.4 % (ref 11.5–15.5)
WBC: 10.3 10*3/uL (ref 4.0–10.5)
nRBC: 0 % (ref 0.0–0.2)

## 2023-12-05 SURGERY — Surgical Case
Anesthesia: Epidural

## 2023-12-05 MED ORDER — CEFAZOLIN SODIUM-DEXTROSE 3-4 GM/150ML-% IV SOLN
3.0000 g | INTRAVENOUS | Status: AC
  Administered 2023-12-05: 3 g via INTRAVENOUS

## 2023-12-05 MED ORDER — SODIUM CHLORIDE 0.9% FLUSH: 3.0000 mL | INTRAVENOUS | Status: DC | PRN

## 2023-12-05 MED ORDER — SENNOSIDES-DOCUSATE SODIUM 8.6-50 MG PO TABS
2.0000 | ORAL_TABLET | Freq: Every evening | ORAL | Status: DC | PRN
  Administered 2023-12-06: 2 via ORAL
  Filled 2023-12-05: qty 2

## 2023-12-05 MED ORDER — SOD CITRATE-CITRIC ACID 500-334 MG/5ML PO SOLN
30.0000 mL | ORAL | Status: AC
  Administered 2023-12-05: 30 mL via ORAL

## 2023-12-05 MED ORDER — CALCIUM CARBONATE ANTACID 500 MG PO CHEW
400.0000 mg | CHEWABLE_TABLET | Freq: Once | ORAL | Status: AC
  Administered 2023-12-05: 400 mg via ORAL
  Filled 2023-12-05: qty 2

## 2023-12-05 MED ORDER — COCONUT OIL OIL: 1.0000 | TOPICAL_OIL | Status: DC | PRN

## 2023-12-05 MED ORDER — NALOXONE HCL 4 MG/10ML IJ SOLN: 1.0000 ug/kg/h | INTRAVENOUS | Status: DC | PRN

## 2023-12-05 MED ORDER — DEXMEDETOMIDINE HCL IN NACL 80 MCG/20ML IV SOLN
INTRAVENOUS | Status: AC
  Filled 2023-12-05: qty 20

## 2023-12-05 MED ORDER — MORPHINE SULFATE (PF) 0.5 MG/ML IJ SOLN
INTRAMUSCULAR | Status: DC | PRN
Start: 2023-12-05 — End: 2023-12-05
  Administered 2023-12-05: 3 mg via EPIDURAL

## 2023-12-05 MED ORDER — KETOROLAC TROMETHAMINE 30 MG/ML IJ SOLN: 30.0000 mg | Freq: Four times a day (QID) | INTRAMUSCULAR | Status: AC | PRN

## 2023-12-05 MED ORDER — SCOPOLAMINE 1 MG/3DAYS TD PT72: 1.0000 | MEDICATED_PATCH | Freq: Once | TRANSDERMAL | Status: DC

## 2023-12-05 MED ORDER — DIPHENHYDRAMINE HCL 25 MG PO CAPS
25.0000 mg | ORAL_CAPSULE | Freq: Four times a day (QID) | ORAL | Status: DC | PRN
  Administered 2023-12-06: 25 mg via ORAL
  Filled 2023-12-05: qty 1

## 2023-12-05 MED ORDER — OXYTOCIN-SODIUM CHLORIDE 30-0.9 UT/500ML-% IV SOLN
2.5000 [IU]/h | INTRAVENOUS | Status: AC
  Administered 2023-12-06: 2.5 [IU]/h via INTRAVENOUS

## 2023-12-05 MED ORDER — DROPERIDOL 2.5 MG/ML IJ SOLN: 0.6250 mg | Freq: Once | INTRAMUSCULAR | Status: DC | PRN

## 2023-12-05 MED ORDER — NALOXONE HCL 0.4 MG/ML IJ SOLN: 0.4000 mg | INTRAMUSCULAR | Status: DC | PRN

## 2023-12-05 MED ORDER — NIFEDIPINE ER OSMOTIC RELEASE 30 MG PO TB24
30.0000 mg | ORAL_TABLET | Freq: Every day | ORAL | Status: DC
  Filled 2023-12-05: qty 1

## 2023-12-05 MED ORDER — POLYETHYLENE GLYCOL 3350 17 G PO PACK
17.0000 g | PACK | ORAL | Status: DC
  Administered 2023-12-06 – 2023-12-08 (×2): 17 g via ORAL
  Filled 2023-12-05 (×2): qty 1

## 2023-12-05 MED ORDER — ACETAMINOPHEN 10 MG/ML IV SOLN: 1000.0000 mg | Freq: Once | INTRAVENOUS | Status: DC | PRN

## 2023-12-05 MED ORDER — ACETAMINOPHEN 500 MG PO TABS: 1000.0000 mg | ORAL_TABLET | Freq: Once | ORAL | Status: DC

## 2023-12-05 MED ORDER — IBUPROFEN 600 MG PO TABS
600.0000 mg | ORAL_TABLET | Freq: Four times a day (QID) | ORAL | Status: DC
  Administered 2023-12-06 – 2023-12-08 (×10): 600 mg via ORAL
  Filled 2023-12-05 (×10): qty 1

## 2023-12-05 MED ORDER — DEXMEDETOMIDINE HCL IN NACL 80 MCG/20ML IV SOLN
INTRAVENOUS | Status: DC | PRN
  Administered 2023-12-05: 12 ug via INTRAVENOUS

## 2023-12-05 MED ORDER — OXYTOCIN-SODIUM CHLORIDE 30-0.9 UT/500ML-% IV SOLN
INTRAVENOUS | Status: DC | PRN
  Administered 2023-12-05: 30 [IU] via INTRAVENOUS

## 2023-12-05 MED ORDER — LIDOCAINE-EPINEPHRINE (PF) 2 %-1:200000 IJ SOLN
INTRAMUSCULAR | Status: DC | PRN
  Administered 2023-12-05 (×2): 5 mL via EPIDURAL

## 2023-12-05 MED ORDER — ACETAMINOPHEN 10 MG/ML IV SOLN
INTRAVENOUS | Status: DC | PRN
  Administered 2023-12-05: 1000 mg via INTRAVENOUS

## 2023-12-05 MED ORDER — FENTANYL CITRATE (PF) 100 MCG/2ML IJ SOLN
INTRAMUSCULAR | Status: AC
  Filled 2023-12-05: qty 2

## 2023-12-05 MED ORDER — ACETAMINOPHEN 10 MG/ML IV SOLN
INTRAVENOUS | Status: AC
  Filled 2023-12-05: qty 100

## 2023-12-05 MED ORDER — FENTANYL CITRATE (PF) 100 MCG/2ML IJ SOLN
INTRAMUSCULAR | Status: DC | PRN
  Administered 2023-12-05: 50 ug via INTRAVENOUS

## 2023-12-05 MED ORDER — ONDANSETRON HCL 4 MG/2ML IJ SOLN
INTRAMUSCULAR | Status: AC
  Filled 2023-12-05: qty 2

## 2023-12-05 MED ORDER — POTASSIUM CHLORIDE CRYS ER 20 MEQ PO TBCR
20.0000 meq | EXTENDED_RELEASE_TABLET | Freq: Every day | ORAL | Status: DC
  Administered 2023-12-06 – 2023-12-08 (×3): 20 meq via ORAL
  Filled 2023-12-05 (×3): qty 1

## 2023-12-05 MED ORDER — ZOLPIDEM TARTRATE 5 MG PO TABS: 5.0000 mg | ORAL_TABLET | Freq: Every evening | ORAL | Status: DC | PRN

## 2023-12-05 MED ORDER — KETOROLAC TROMETHAMINE 30 MG/ML IJ SOLN
INTRAMUSCULAR | Status: AC
  Filled 2023-12-05: qty 1

## 2023-12-05 MED ORDER — DIBUCAINE (PERIANAL) 1 % EX OINT: 1.0000 | TOPICAL_OINTMENT | CUTANEOUS | Status: DC | PRN

## 2023-12-05 MED ORDER — MORPHINE SULFATE (PF) 0.5 MG/ML IJ SOLN
INTRAMUSCULAR | Status: AC
  Filled 2023-12-05: qty 10

## 2023-12-05 MED ORDER — PHENYLEPHRINE 80 MCG/ML (10ML) SYRINGE FOR IV PUSH (FOR BLOOD PRESSURE SUPPORT)
PREFILLED_SYRINGE | INTRAVENOUS | Status: DC | PRN
  Administered 2023-12-05 (×4): 80 ug via INTRAVENOUS

## 2023-12-05 MED ORDER — DIPHENHYDRAMINE HCL 50 MG/ML IJ SOLN: 12.5000 mg | INTRAMUSCULAR | Status: DC | PRN

## 2023-12-05 MED ORDER — PHENYLEPHRINE 80 MCG/ML (10ML) SYRINGE FOR IV PUSH (FOR BLOOD PRESSURE SUPPORT)
PREFILLED_SYRINGE | INTRAVENOUS | Status: AC
  Filled 2023-12-05: qty 10

## 2023-12-05 MED ORDER — MEPERIDINE HCL 25 MG/ML IJ SOLN: 6.2500 mg | INTRAMUSCULAR | Status: DC | PRN

## 2023-12-05 MED ORDER — SIMETHICONE 80 MG PO CHEW
80.0000 mg | CHEWABLE_TABLET | Freq: Three times a day (TID) | ORAL | Status: DC
  Administered 2023-12-06 – 2023-12-08 (×7): 80 mg via ORAL
  Filled 2023-12-05 (×7): qty 1

## 2023-12-05 MED ORDER — SIMETHICONE 80 MG PO CHEW
80.0000 mg | CHEWABLE_TABLET | ORAL | Status: DC | PRN
  Administered 2023-12-06: 80 mg via ORAL
  Filled 2023-12-05: qty 1

## 2023-12-05 MED ORDER — OXYCODONE HCL 5 MG PO TABS
5.0000 mg | ORAL_TABLET | ORAL | Status: DC | PRN
  Administered 2023-12-06 – 2023-12-07 (×4): 10 mg via ORAL
  Filled 2023-12-05 (×4): qty 2

## 2023-12-05 MED ORDER — SODIUM CHLORIDE 0.9 % IV SOLN
500.0000 mg | INTRAVENOUS | Status: AC
  Administered 2023-12-05: 500 mg via INTRAVENOUS

## 2023-12-05 MED ORDER — ONDANSETRON HCL 4 MG/2ML IJ SOLN: 4.0000 mg | Freq: Three times a day (TID) | INTRAMUSCULAR | Status: DC | PRN

## 2023-12-05 MED ORDER — KETOROLAC TROMETHAMINE 30 MG/ML IJ SOLN
INTRAMUSCULAR | Status: DC | PRN
Start: 2023-12-05 — End: 2023-12-05
  Administered 2023-12-05: 30 mg via INTRAVENOUS

## 2023-12-05 MED ORDER — FENTANYL CITRATE (PF) 100 MCG/2ML IJ SOLN
INTRAMUSCULAR | Status: DC | PRN
  Administered 2023-12-05: 100 ug via EPIDURAL

## 2023-12-05 MED ORDER — FUROSEMIDE 20 MG PO TABS
20.0000 mg | ORAL_TABLET | Freq: Every day | ORAL | Status: DC
  Administered 2023-12-06 – 2023-12-08 (×3): 20 mg via ORAL
  Filled 2023-12-05 (×3): qty 1

## 2023-12-05 MED ORDER — ONDANSETRON HCL 4 MG/2ML IJ SOLN
INTRAMUSCULAR | Status: DC | PRN
  Administered 2023-12-05: 4 mg via INTRAVENOUS

## 2023-12-05 MED ORDER — PRENATAL MULTIVITAMIN CH
1.0000 | ORAL_TABLET | Freq: Every day | ORAL | Status: DC
  Administered 2023-12-06 – 2023-12-08 (×3): 1 via ORAL
  Filled 2023-12-05 (×3): qty 1

## 2023-12-05 MED ORDER — WITCH HAZEL-GLYCERIN EX PADS
1.0000 | MEDICATED_PAD | CUTANEOUS | Status: DC | PRN
  Administered 2023-12-07: 1 via TOPICAL

## 2023-12-05 MED ORDER — MENTHOL 3 MG MT LOZG: 1.0000 | LOZENGE | OROMUCOSAL | Status: DC | PRN

## 2023-12-05 MED ORDER — DIPHENHYDRAMINE HCL 25 MG PO CAPS: 25.0000 mg | ORAL_CAPSULE | ORAL | Status: DC | PRN

## 2023-12-05 MED ORDER — FENTANYL CITRATE (PF) 100 MCG/2ML IJ SOLN
25.0000 ug | INTRAMUSCULAR | Status: DC | PRN
  Administered 2023-12-05: 25 ug via INTRAVENOUS

## 2023-12-05 MED ORDER — GABAPENTIN 100 MG PO CAPS
300.0000 mg | ORAL_CAPSULE | Freq: Two times a day (BID) | ORAL | Status: DC
  Administered 2023-12-06 – 2023-12-08 (×6): 300 mg via ORAL
  Filled 2023-12-05 (×6): qty 3

## 2023-12-05 SURGICAL SUPPLY — 33 items
BENZOIN TINCTURE PRP APPL 2/3 (GAUZE/BANDAGES/DRESSINGS) ×1 IMPLANT
CANISTER SUCT 3000ML PPV (MISCELLANEOUS) ×1 IMPLANT
CHLORAPREP W/TINT 26 (MISCELLANEOUS) ×2 IMPLANT
CLAMP UMBILICAL CORD (MISCELLANEOUS) ×1 IMPLANT
DRESSING PREVENA PLUS CUSTOM (GAUZE/BANDAGES/DRESSINGS) IMPLANT
DRSG OPSITE POSTOP 4X10 (GAUZE/BANDAGES/DRESSINGS) ×1 IMPLANT
ELECTRODE REM PT RTRN 9FT ADLT (ELECTROSURGICAL) ×1 IMPLANT
EXTENDER TRAXI PANNICULUS (MISCELLANEOUS) IMPLANT
EXTRACTOR VACUUM KIWI (MISCELLANEOUS) ×1 IMPLANT
GLOVE BIOGEL PI IND STRL 7.0 (GLOVE) ×2 IMPLANT
GLOVE BIOGEL PI IND STRL 7.5 (GLOVE) ×1 IMPLANT
GLOVE SURG SS PI 7.0 STRL IVOR (GLOVE) ×1 IMPLANT
GOWN STRL REUS W/ TWL LRG LVL3 (GOWN DISPOSABLE) ×2 IMPLANT
GOWN STRL REUS W/ TWL XL LVL3 (GOWN DISPOSABLE) ×1 IMPLANT
NS IRRIG 1000ML POUR BTL (IV SOLUTION) ×1 IMPLANT
PACK C SECTION WH (CUSTOM PROCEDURE TRAY) ×1 IMPLANT
PAD ABD 7.5X8 STRL (GAUZE/BANDAGES/DRESSINGS) ×1 IMPLANT
PAD OB MATERNITY 4.3X12.25 (PERSONAL CARE ITEMS) ×1 IMPLANT
PAD PREP 24X48 CUFFED NSTRL (MISCELLANEOUS) ×1 IMPLANT
RETRACTOR TRAXI PANNICULUS (MISCELLANEOUS) IMPLANT
RETRACTOR WND ALEXIS 25 LRG (MISCELLANEOUS) ×1 IMPLANT
RETRACTOR WOUND ALXS 25CM LRG (MISCELLANEOUS) ×1 IMPLANT
STRIP CLOSURE SKIN 1/2X4 (GAUZE/BANDAGES/DRESSINGS) ×1 IMPLANT
SUT MNCRL 0 VIOLET CTX 36 (SUTURE) ×2 IMPLANT
SUT MON AB 4-0 PS1 27 (SUTURE) ×1 IMPLANT
SUT PLAIN 2 0 XLH (SUTURE) ×1 IMPLANT
SUT VIC AB 0 CT1 36 (SUTURE) ×2 IMPLANT
SUT VIC AB 2-0 CT1 TAPERPNT 27 (SUTURE) IMPLANT
SUT VIC AB 3-0 CT1 TAPERPNT 27 (SUTURE) ×1 IMPLANT
SUT VICRYL+ 3-0 36IN CT-1 (SUTURE) IMPLANT
SYR 3ML 25GX5/8 SAFETY (SYRINGE) IMPLANT
TOWEL OR 17X24 6PK STRL BLUE (TOWEL DISPOSABLE) ×2 IMPLANT
WATER STERILE IRR 1000ML POUR (IV SOLUTION) ×1 IMPLANT

## 2023-12-05 NOTE — Progress Notes (Signed)
 LABOR PROGRESS NOTE  Patient Name: Bethany Pierce, female   DOB: 2000-12-27, 23 y.o.  MRN: [id number]  FHT with prolonged deceleration with switching to maternal right.  Resolved with position changes, peripheral bolus and halfing the pitocin rate from 16U to 8U.  Continue with pit titration as tolerated.   Ebony Goldstein, MD

## 2023-12-05 NOTE — Progress Notes (Signed)
 Pt says skin is now burning from monitor straps and is in obvious discomfort when adjusting monitors.  Dr Hubert Madden asked about internal monitors being placed.  She says that they will discuss this with Dr Vallarie Gauze,  but says they were hoping to avoid this because of infection risks.

## 2023-12-05 NOTE — Progress Notes (Signed)
 Patient ID: Bethany Pierce, female   DOB: 2001-05-21, 23 y.o.   MRN: [id number]  Called to patient's room for question of position. US  confirms vtx, but OP. Vaginal exam done   4.5/100/-1  Will continue to titrate pit and rotate  Emmalie Haigh J Lakia Gritton, DO 12/05/2023 12:51 PM

## 2023-12-05 NOTE — Progress Notes (Signed)
 LABOR PROGRESS NOTE  Patient Name: Bethany Pierce, female   DOB: 2000/11/18, 23 y.o.  MRN: [id number]  During safety rounds the decision was made to internalize patient at [facility] of 10, Pit at 8U and scheduled to go to 10U in .  Decision after shared decision making with patient to place both IUPC and FSE despite Cat 1 Strip but save a check later given infection risk especially now with in-dwellings.  Continue titration of pit as tolerated.  Ebony Goldstein, MD

## 2023-12-05 NOTE — Discharge Summary (Addendum)
 Postpartum Discharge Summary   Patient Name: Bethany Pierce DOB: January 19, 2001 MRN: [id number]  Date of admission: 12/03/2023 Delivery date:12/05/2023 Delivering provider: Raynell Caller Date of discharge: 12/08/2023 OB Clinic: [facility]  Admitting diagnosis: Pregnancy at 38/1. IOL for poorly controlled CHTN. BMI 69.   Secondary diagnosis:  Principal Problem:   Chronic hypertension in obstetric context in third trimester Active Problems:   BMI 60.0-69.9, adult (HCC)   Hypothyroidism   Fetal renal anomaly, single gestation (mildly echogenic)   Positive testing for group B Streptococcus   Chlamydia infection affecting pregnancy in third trimester   Cephalopelvic disproportion    Discharge diagnosis: Term Pregnancy Delivered. CPD Post partum procedures: none Augmentation: AROM, Pitocin , Cytotec , and IP Foley Complications: arrest of dilation. Fetal intolerance of labor  Hospital course: Patient admitted for above indication. Patient did not progress past 5-5.5cm/100/BPD at 0 station. This was after >24 hours ruptured and on pitocin , along with inability to augment with pitocin , due to fetal intolerance, and caput noted. Due to this c-section recommended and she accepted. She had a PLTCS with large amount of CPD noted and Prevena placed; TOLAC not recommended. Patient had a postpartum course that was uncomplicated. Procardia  was discontinued due to hypotension postpartum. She is ambulating, tolerating a regular diet, passing flatus, and urinating well.  Patient is discharged home in stable condition on 12/08/23.      On day of discharge, she was seen by social work and expressed a history of anxiety and depression. She is interested in medication to help with these symptoms and requesting a prescription at discharge. I reviewed warning signs of postpartum depression and anxiety. She denies SI/HI. Rx for zoloft  given.  Newborn Data: Birth date:12/05/2023 Birth time:9:36  PM Gender:Female Living status:Living Apgars:6 ,8  Weight:3317 g                               Magnesium Sulfate received: No BMZ received: No Rhophylac:No MMR:No T-DaP:Given prenatally Flu: N/A RSV Vaccine received: No Transfusion:No Immunizations administered: Immunization History  Administered Date(s) Administered   Influenza, Seasonal, Injecte, Preservative Fre 06/06/2023   Tdap 09/26/2023    Physical exam  Vitals:   12/07/23 0440 12/07/23 1502 12/07/23 2127 12/08/23 0600  BP: 115/71 131/82 111/65 114/70  Pulse: 77 94 99 100  Resp: 16 17 18 18   Temp: 97.9 F (36.6 C) 97.9 F (36.6 C) 98 F (36.7 C) (!) 97.5 F (36.4 C)  TempSrc: Oral Oral Oral Oral  SpO2: 98% 99% 100% 100%  Weight:      Height:       General: alert, cooperative, and no distress Lochia: appropriate Uterine Fundus: firm, limited exam due to body habitus Incision: Dressing is clean, dry, and intact, Prevena in place DVT Evaluation: No evidence of DVT seen on physical exam. Labs: Lab Results  Component Value Date   WBC 15.2 (H) 12/06/2023   HGB 9.2 (L) 12/06/2023   HCT 27.5 (L) 12/06/2023   MCV 92.3 12/06/2023   PLT 261 12/06/2023      Latest Ref Rng & Units 12/03/2023    3:52 PM  CMP  Glucose 70 - 99 mg/dL 76   BUN 6 - 20 mg/dL 6   Creatinine 3.08 - 6.57 mg/dL 8.46   Sodium 962 - 952 mmol/L 138   Potassium 3.5 - 5.1 mmol/L 3.6   Chloride 98 - 111 mmol/L 109   CO2 22 - 32 mmol/L 20  Calcium  8.9 - 10.3 mg/dL 9.0   Total Protein 6.5 - 8.1 g/dL 6.3   Total Bilirubin 0.0 - 1.2 mg/dL 0.4   Alkaline Phos 38 - 126 U/L 115   AST 15 - 41 U/L 24   ALT 0 - 44 U/L 21    Edinburgh Score:    12/05/2023   11:54 PM  Edinburgh Postnatal Depression Scale Screening Tool  I have been able to laugh and see the funny side of things. 0  I have looked forward with enjoyment to things. 0  I have blamed myself unnecessarily when things went wrong. 2  I have been anxious or worried for no good reason. 0   I have felt scared or panicky for no good reason. 0  Things have been getting on top of me. 1  I have been so unhappy that I have had difficulty sleeping. 1  I have felt sad or miserable. 0  I have been so unhappy that I have been crying. 1  The thought of harming myself has occurred to me. 0  Edinburgh Postnatal Depression Scale Total 5      After visit meds:  Allergies as of 12/08/2023   No Known Allergies      Medication List     STOP taking these medications    aspirin  EC 81 MG tablet   NIFEdipine  60 MG 24 hr tablet Commonly known as: PROCARDIA  XL/NIFEDICAL XL   valACYclovir  1000 MG tablet Commonly known as: VALTREX        TAKE these medications    acetaminophen  500 MG tablet Commonly known as: TYLENOL  Take 1 tablet (500 mg total) by mouth every 6 (six) hours as needed for moderate pain (pain score 4-6).   enoxaparin  100 MG/ML injection Commonly known as: LOVENOX  Inject 1 mL (100 mg total) into the skin daily for 28 days.   furosemide  20 MG tablet Commonly known as: LASIX  Take 1 tablet (20 mg total) by mouth daily for 2 days. Start taking on: Dec 09, 2023   ibuprofen  600 MG tablet Commonly known as: ADVIL  Take 1 tablet (600 mg total) by mouth every 6 (six) hours.   levothyroxine  50 MCG tablet Commonly known as: Synthroid  Take 1 tablet (50 mcg total) by mouth daily before breakfast.   norethindrone  0.35 MG tablet Commonly known as: MICRONOR  Take 1 tablet (0.35 mg total) by mouth daily.   Omron 3 Series BP Monitor Devi Use to check blood pressure daily   oxyCODONE  5 MG immediate release tablet Commonly known as: Oxy IR/ROXICODONE  Take 1 tablet (5 mg total) by mouth every 4 (four) hours as needed for up to 3 days for severe pain (pain score 7-10).   potassium chloride  SA 20 MEQ tablet Commonly known as: KLOR-CON  M Take 1 tablet (20 mEq total) by mouth daily for 2 days. Start taking on: Dec 09, 2023   PRENATAL VITAMIN PO Take 1 tablet by mouth  daily.   sertraline  25 MG tablet Commonly known as: ZOLOFT  Take 1 tablet (25 mg total) by mouth daily.       Attestation regarding controlled substance review:  I performed a 12 month review in the [place]  Controlled Substance Review System ([place] CSRS) prior to prescribing any Schedule II or Schedule III Opiates or narcotic per the requirement of the STOP ACT.  If there was a technical or electronic difficulty, I reviewed the [place] CSRS after this issue was resolved.    Discharge home in stable condition Infant Feeding: Bottle  Infant Disposition:home with mother Discharge instruction: per After Visit Summary and Postpartum booklet. Activity: Advance as tolerated. Pelvic rest for 6 weeks.  Diet: routine diet Anticipated Birth Control: POPs Postpartum Appointment:6 weeks Additional Postpartum F/U: Incision check 1 week and BP check 1 week Future Appointments: Future Appointments  Date Time Provider Department Center  12/13/2023  1:40 PM [facility] NURSE [facility] None  12/16/2023  2:40 PM Tobb, Kardie, DO [facility] None  01/10/2024  8:55 AM [place] Atta, Jodelle Mungo, MD [facility] None   Follow up Visit: [X]  1wk prevena removal and bp check request sent on 4/28 Appointments scheduled for 12/13/23  I spent > 30 minutes in the discharge of this patient including chart review, patient examination and counseling, documentation, orders.    12/08/2023 Marci Setter, MD

## 2023-12-05 NOTE — Progress Notes (Signed)
 L&D Note  12/05/2023 - 5:36 PM  23 y.o. G1P0000 [redacted]w[redacted]d. 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 is admitted for IOL for poorly controlled CHTN   Subjective:  Patient comfortable with epidural in place  Objective:    Current Vital Signs 24h Vital Sign Ranges  T 98.3 F (36.8 C) Temp  Avg: 98.3 F (36.8 C)  Min: 97.9 F (36.6 C)  Max: 98.8 F (37.1 C)  BP 121/79 BP  Min: 84/55  Max: 152/91  HR 71 Pulse  Avg: 74.3  Min: 54  Max: 124  RR 16 Resp  Avg: 18.3  Min: 16  Max: 20  SaO2 99 %   SpO2  Avg: 98.3 %  Min: 94 %  Max: 100 %       24 Hour I/O Current Shift I/O  Time Ins Outs 04/27 0701 - 04/28 0700 In: -  Out: 1400 [Urine:1400] 04/28 0701 - 04/28 1900 In: -  Out: 1200 [Urine:1200]   FHR: 145 baseline, no accels, no recent decels, minimal variability Toco: q1-26m MVUs 150s Gen: NAD SVE: deferred  Labs:  Recent Labs  Lab 12/03/23 1552 12/04/23 1809  WBC 10.7* 12.2*  HGB 10.8* 11.3*  HCT 32.3* 34.7*  PLT 288 295   Recent Labs  Lab 12/03/23 1552  NA 138  K 3.6  CL 109  CO2 20*  BUN 6  CREATININE 0.64  CALCIUM 9.0  PROT 6.3*  BILITOT 0.4  ALKPHOS 115  ALT 21  AST  24  GLUCOSE 76    Medications Current Facility-Administered Medications  Medication Dose Route Frequency Provider Last Rate Last Admin   acetaminophen (TYLENOL) tablet 650 mg  650 mg Oral Q4H PRN Bethany Abler, MD   650 mg at 12/03/23 1541   diphenhydrAMINE (BENADRYL) injection 25 mg  25 mg Intravenous Q4H PRN Kumar, Agnijita, MD   25 mg at 12/05/23 1610   ePHEDrine injection 10 mg  10 mg Intravenous PRN Bethany Gaines, MD       ePHEDrine injection 10 mg  10 mg Intravenous PRN Bethany Gaines, MD       fentaNYL (SUBLIMAZE) injection 50-100 mcg  50-100 mcg Intravenous Q1H PRN Bethany Abler, MD   100 mcg at 12/04/23 1720   fentaNYL 2 mcg/mL w/ bupivacaine 0.125% in NS 250 mL epidural infusion  12 mL/hr Epidural Continuous PRN Bethany Gaines, MD 12 mL/hr at 12/05/23 1329 12 mL/hr at 12/05/23 1329   lactated ringers infusion 500 mL  500 mL Intravenous Once Bethany Gaines, MD       lactated ringers infusion 500-1,000 mL  500-1,000 mL Intravenous PRN Kumar, Agnijita, MD       lactated ringers infusion   Intravenous Continuous Kumar, Agnijita, MD 125 mL/hr at 12/05/23 1727 New Bag at  12/05/23 1727   levothyroxine  (SYNTHROID ) tablet 50 mcg  50 mcg Oral QAC breakfast Kumar, Agnijita, MD   50 mcg at 12/05/23 0556   lidocaine  (PF) (XYLOCAINE ) 1 % injection 30 mL  30 mL Subcutaneous PRN Bethany Abler, MD       misoprostol (CYTOTEC) tablet 50 mcg  50 mcg Buccal Q4H PRN Leveque, Alyssa, MD   50 mcg at 12/03/23 2229   NIFEdipine  (PROCARDIA -XL/NIFEDICAL-XL) 24 hr tablet 60 mg  60 mg Oral Daily Kumar, Agnijita, MD   60 mg at 12/05/23 1009   ondansetron  (ZOFRAN ) injection 4 mg  4 mg Intravenous Q6H PRN Bethany Abler, MD   4 mg at 12/05/23 1534   oxyCODONE-acetaminophen (PERCOCET/ROXICET) 5-325 MG per tablet 1 tablet  1 tablet Oral Q4H PRN Bethany Abler, MD       oxytocin (PITOCIN) IV BOLUS FROM BAG  333 mL Intravenous Once Bethany Abler, MD       oxytocin (PITOCIN) IV infusion 30 units in  NS 500 mL - Premix  2.5 Units/hr Intravenous Continuous Bethany Abler, MD       oxytocin (PITOCIN) IV infusion 30 units in NS 500 mL - Premix  1-40 milli-units/min Intravenous Titrated Bethany Abler, MD 10 mL/hr at 12/05/23 1530 10 milli-units/min at 12/05/23 1530   penicillin G potassium 3 Million Units in dextrose 50mL IVPB  3 Million Units Intravenous Q4H Bethany Abler, MD 100 mL/hr at 12/05/23 1404 3 Million Units at 12/05/23 1404   PHENYLephrine 80 mcg/ml in normal saline Adult IV Push Syringe (For Blood Pressure Support)  80-160 mcg Intravenous PRN Bethany Gaines, MD       PHENYLephrine 80 mcg/ml in normal saline Adult IV Push Syringe (For Blood Pressure Support)  80 mcg Intravenous PRN Bethany Gaines, MD       sodium citrate-citric acid (ORACIT) solution 30 mL  30 mL Oral Q2H PRN Bethany Abler, MD       terbutaline (BRETHINE) injection 0.25 mg  0.25 mg Subcutaneous Once PRN Bethany Abler, MD       Facility-Administered Medications Ordered in Other Encounters  Medication Dose Route Frequency Provider Last Rate Last Admin   lidocaine  (PF) (XYLOCAINE ) 1 % injection   Epidural Anesthesia Intra-op Bethany Havers, MD   2 mL at 12/04/23 1856    Assessment & Plan:  Patient stable *Pregnancy: baby currently category II with IUPC, FSE in place. Last check was 5-6/100/-1 at 1645 which was a chance as patient had been 4-5 since at least 1600 yesterday with her AROM. I told her I recommend another cervical check at 1830 and if unchanged then consideration for a c-section given she is now in active labor and on pitocin, currently at 10.  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 No prodromal s/s.  *GBS: s/p PCN x 2 *Analgesia: comfortable with epidural in place  Bethany Gallant MD Attending Center for [facility])

## 2023-12-05 NOTE — Discharge Instructions (Addendum)
Hypertension During Pregnancy Hypertension is also called high blood pressure. High blood pressure means that the force of your blood moving in your body is too strong. It can cause problems for you and your baby. Different types of high blood pressure can happen during pregnancy. The types are:  High blood pressure before you got pregnant. This is called chronic hypertension.  This can continue during your pregnancy. Your doctor will want to keep checking your blood pressure. You may need medicine to keep your blood pressure under control while you are pregnant. You will need follow-up visits after you have your baby.  High blood pressure that goes up during pregnancy when it was normal before. This is called gestational hypertension. It will usually get better after you have your baby, but your doctor will need to watch your blood pressure to make sure that it is getting better.  Very high blood pressure during pregnancy. This is called preeclampsia. Very high blood pressure is an emergency that needs to be checked and treated right away.  You may develop very high blood pressure after giving birth. This is called postpartum preeclampsia. This usually occurs within 48 hours after childbirth but may occur up to 6 weeks after giving birth. This is rare. How does this affect me? If you have high blood pressure during pregnancy, you have a higher chance of developing high blood pressure:  As you get older.  If you get pregnant again. In some cases, high blood pressure during pregnancy can cause:  Stroke.  Heart attack.  Damage to the kidneys, lungs, or liver.  Preeclampsia.  Jerky movements you cannot control (convulsions or seizures).  Problems with the placenta.   What can I do to lower my risk?   Keep a healthy weight.  Eat a healthy diet.  Follow what your doctor tells you about treating any medical problems that you had before becoming pregnant. It is very important to go to  all of your doctor visits. Your doctor will check your blood pressure and make sure that your pregnancy is progressing as it should. Treatment should start early if a problem is found.   Follow these instructions at home:  Take your blood pressure 1-2 times per day. Call the office if your blood pressure is 155 or higher for the top number or 105 or higher for the bottom number.    Eating and drinking   Drink enough fluid to keep your pee (urine) pale yellow.  Avoid caffeine. Lifestyle  Do not use any products that contain nicotine or tobacco, such as cigarettes, e-cigarettes, and chewing tobacco. If you need help quitting, ask your doctor.  Do not use alcohol or drugs.  Avoid stress.  Rest and get plenty of sleep.  Regular exercise can help. Ask your doctor what kinds of exercise are best for you. General instructions  Take over-the-counter and prescription medicines only as told by your doctor.  Keep all prenatal and follow-up visits as told by your doctor. This is important. Contact a doctor if:  You have symptoms that your doctor told you to watch for, such as: ? Headaches. ? Nausea. ? Vomiting. ? Belly (abdominal) pain. ? Dizziness. ? Light-headedness. Get help right away if:  You have: ? Very bad belly pain that does not get better with treatment. ? A very bad headache that does not get better. ? Vomiting that does not get better. ? Sudden, fast weight gain. ? Sudden swelling in your hands, ankles, or face. ?   Blood in your pee. ? Blurry vision. ? Double vision. ? Shortness of breath. ? Chest pain. ? Weakness on one side of your body. ? Trouble talking. Summary  High blood pressure is also called hypertension.  High blood pressure means that the force of your blood moving in your body is too strong.  High blood pressure can cause problems for you and your baby.  Keep all follow-up visits as told by your doctor. This is important. This information is  not intended to replace advice given to you by your health care provider. Make sure you discuss any questions you have with your health care provider. Document Released: 08/28/2010 Document Revised: 11/16/2018 Document Reviewed: 08/22/2018 Elsevier Patient Education  2020 [employer]   

## 2023-12-05 NOTE — Progress Notes (Signed)
 Labor Progress Note Bethany Pierce is a 23 y.o. G1P0000 at [redacted]w[redacted]d presented for IOL due to cHTN  In to recheck cx -- pt now 4 hours out from discontinuing Pitocin. Cervical exam unchanged. Cat I tracing. Will resume Pitocin 2x2, plan discussed w pt.    Melanie Spires, MD 6:44 AM

## 2023-12-05 NOTE — Op Note (Signed)
 Operative Note   SURGERY DATE: 12/05/2023  PRE-OP DIAGNOSIS:  *Pregnancy at 38/3 *Failed IOL for poorly controlled CHTN *Arrest of dilation at 5-6cm *BMI 69  POST-OP DIAGNOSIS: Same. Delivered. Cephalopelvic disproportion   PROCEDURE: primary low transverse cesarean section via pfannenstiel skin incision with single layer uterine closure and Prevena device placement  SURGEON: Surgeons and Role:    * Raynell Caller, MD - Primary  ASSISTANT:    Darrow End, MD - Fellow  An experienced assistant was required given the standard of surgical care given the complexity of the case.  This assistant was needed for exposure, dissection, suctioning, retraction, instrument exchange, assisting with delivery with administration of fundal pressure, and for overall help during the procedure.  ANESTHESIA: epidural  ESTIMATED BLOOD LOSS:   DRAINS: UOP via indwelling foley  TOTAL IV FLUIDS: crystalloid  VTE PROPHYLAXIS: SCDs to bilateral lower extremities  ANTIBIOTICS: Three grams of Cefazolin were given., within 1 hour of skin incision and azithromycin  500mg  IV given intraoperatively  SPECIMENS: none  COMPLICATIONS: none  FINDINGS: No intra-abdominal adhesions were noted. Grossly normal uterus, tubes and ovaries. Clear amniotic fluid, cephalic, direct OP with a very large amount of caput, female infant, weight 3317gm, APGARs 6/8, intact placenta.  Cord gases Arterial pH 7.08/CO2 78/bicarb 23.1 A-B deficit 8.4 Venous pH 7.17/CO2 65/bicarb 23.7/ A-B deficit 5.9  PROCEDURE IN DETAIL: The patient was taken to the operating room where anesthesia was administered and normal fetal heart tones were confirmed. She was then prepped and draped in the normal fashion in the dorsal supine position with a leftward tilt; a Traxi retractor was also placed.  After a time out was performed, a pfannensteil skin incision was made with the scalpel and carried through to the underlying layer  of fascia. The fascia was then incised at the midline and this incision was extended laterally with the mayo scissors. Attention was turned to the superior aspect of the fascial incision which was grasped with the kocher clamps x 2, tented up and the rectus muscles were dissected off with the bovie. In a similar fashion the inferior aspect of the fascial incision was grasped with the kocher clamps, tented up and the rectus muscles dissected off with the mayo scissors. The rectus muscles were then separated in the midline and the peritoneum was entered bluntly. The bladder blade was inserted and the vesicouterine peritoneum was identified, tented up and entered with the metzenbaum scissors. This incision was extended laterally and the bladder flap was created digitally. The bladder blade was reinserted.  A low transverse hysterotomy was made with the scalpel until the endometrial cavity was breached and the amniotic sac ruptured with the, yielding clear amniotic fluid. This incision was extended bluntly and the infant's head was wedge down in the pelvis. After suction was released, it was decided to deliver the baby breech, which was done without issue.  The cord was clamped x 2 and cut, and the infant was handed to the awaiting pediatricians, after delayed cord clamping was not done.  The placenta was then gradually expressed from the uterus and then the uterus was exteriorized and cleared of all clots and debris. The hysterotomy was repaired with a running suture of 1-0 monocryl to achieve excellent hemostasis.  The uterus and adnexa were then returned to the abdomen, and the hysterotomy and all operative sites were reinspected and excellent hemostasis was noted after irrigation and suction of the abdomen with warm saline.  The peritoneum was closed  with a running stitch of 3-0 Vicryl. The fascia was reapproximated with 0 Vicryl in a simple running fashion bilaterally. The subcutaneous layer was then  reapproximated with interrupted sutures of 2-0 plain gut, and the skin was then closed with 4-0 monocryl, in a subcuticular fashion.  A Prevena was then placed.   The patient  tolerated the procedure well. Sponge, lap, needle, and instrument counts were correct x 2. The patient was transferred to the recovery room awake, alert and breathing independently in stable condition.  I DO NOT RECOMMEND TOLAC FOR HER IN FUTURE PREGNANCIES  Tyler Gallant. MD Attending Center for [facility])

## 2023-12-05 NOTE — Progress Notes (Signed)
 Labor Progress Note Jesselin Grosjean is a 23 y.o. G1P0000 at [redacted]w[redacted]d presented for IOL due to cHTN  At bedside to recheck cx -- essentially unchanged. Cat II tracing (variables), but overall reassuring. Pt has been on Pitocin x 24 hours. Discussed role of Pit break w pt, pt amenable to try. Will Pit break x 4 hours and then resume at 0630.   Melanie Spires, MD 3:09 AM

## 2023-12-05 NOTE — Progress Notes (Addendum)
 OB Note Called to see patient for more pain/pressure SVE: 5.5/100/0 for leading edge, bpd more so -1, IUPC and FSE in place Toco unchanged and babystill category II and had two subtle lates with min to mod variability. Patient placed LLR with peanut ball. Reassess in 1 to 1.5h, will leave pit at 10 for now, since potentially slightly lower station than prior and this was my first exam on her.   Tyler Gallant MD Attending Center for [employer] (Faculty Practice) 12/05/2023 Time: 1800

## 2023-12-05 NOTE — Progress Notes (Signed)
 Labor progress note:  In to discuss internal monitors (IUPC and FSE). Pt reported significant discomfort with bands from external toco/FHR monitors. She has a cat I tracing, back on Pit at 2mU at this time. I explained risks of internal monitors especially given she is in latent labor and concern for potentially longer labor course given course thus far. Risks would include introduction of bacteria ultimately increasing risk of chorioamnionitis, which could potentially lead to fetal distress and need for cesarean delivery. Also discussed potential risks of cesarean delivery in context of pt's comorbidities. Pt expressed understanding of risks of internal monitors at this time. After our discussion, pt opted to have external monitors on for longer, but would let us  know if discomfort became too much for her.   Melanie Spires, MD OB Fellow, Faculty Practice [facility], Center for [facility]

## 2023-12-05 NOTE — Transfer of Care (Signed)
 Immediate Anesthesia Transfer of Care Note  Patient: Bethany Pierce  Procedure(s) Performed: CESAREAN DELIVERY  Patient Location: PACU  Anesthesia Type:Epidural  Level of Consciousness: awake, alert , and oriented  Airway & Oxygen Therapy: Patient Spontanous Breathing  Post-op Assessment: Report given to RN and Post -op Vital signs reviewed and stable  Post vital signs: Reviewed and stable  Last Vitals:  Vitals Value Taken Time  BP 107/76 12/05/23 2240  Temp    Pulse 56 12/05/23 2242  Resp 21 12/05/23 2242  SpO2 95 % 12/05/23 2242  Vitals shown include unfiled device data.  Last Pain:  Vitals:   12/05/23 1905  TempSrc: Axillary  PainSc: 8          Complications: No notable events documented.

## 2023-12-05 NOTE — Progress Notes (Signed)
 Baby still category II with occasional lates and min to mod variability, no accels, with toco q1-1m with MVUs 120s-140s, pitocin stable at 10. SVE unchanged and baby with mild to moderate caput and belly looks OP. I told her that a c-section at this point has high risk for infection, bleeding, need for transfusion, etc and difficult surgery due to her being in labor and her body habitus. I told her I'm not optimistic for a vaginal delivery but I feel that's the best option for her and baby at this point. I told her that baby and her look okay but I feel that going up on her pit would cause repeat episode that happened earlier the day with her cervix not changing and having to go down on it due to fetal intolerance or still arrest of dilation several hours from now and/or getting chorio due to her being ruptured (and on pitocin) for nearly 28 hours at this point.    Patient amenable to proceeding with c/s.  Tyler Gallant MD Attending Center for [employer] (Faculty Practice) 12/05/2023 Time: 2018

## 2023-12-06 ENCOUNTER — Encounter (HOSPITAL_COMMUNITY): Payer: Self-pay | Admitting: Obstetrics and Gynecology

## 2023-12-06 LAB — CBC
HCT: 27.5 % — ABNORMAL LOW (ref 36.0–46.0)
Hemoglobin: 9.2 g/dL — ABNORMAL LOW (ref 12.0–15.0)
MCH: 30.9 pg (ref 26.0–34.0)
MCHC: 33.5 g/dL (ref 30.0–36.0)
MCV: 92.3 fL (ref 80.0–100.0)
Platelets: 261 10*3/uL (ref 150–400)
RBC: 2.98 MIL/uL — ABNORMAL LOW (ref 3.87–5.11)
RDW: 13.5 % (ref 11.5–15.5)
WBC: 15.2 10*3/uL — ABNORMAL HIGH (ref 4.0–10.5)
nRBC: 0 % (ref 0.0–0.2)

## 2023-12-06 MED ORDER — ENOXAPARIN SODIUM 100 MG/ML IJ SOSY
100.0000 mg | PREFILLED_SYRINGE | INTRAMUSCULAR | Status: DC
  Administered 2023-12-06 – 2023-12-07 (×2): 100 mg via SUBCUTANEOUS
  Filled 2023-12-06 (×3): qty 1

## 2023-12-06 MED ORDER — ACETAMINOPHEN 500 MG PO TABS
1000.0000 mg | ORAL_TABLET | Freq: Once | ORAL | Status: AC
  Administered 2023-12-06: 1000 mg via ORAL
  Filled 2023-12-06: qty 2

## 2023-12-06 NOTE — Progress Notes (Signed)
 POSTPARTUM PROGRESS NOTE  Subjective: Bethany Pierce is a 23 y.o. G1P1001 s/p pLTCS at [redacted]w[redacted]d.  She reports she is doing well. No acute events overnight. She denies any problems with ambulating, voiding or po intake. Denies nausea or vomiting. She has passed flatus. Pain is well controlled.  Lochia is moderate.  Objective: Blood pressure (!) 105/54, pulse 81, temperature 98.8 F (37.1 C), temperature source Oral, resp. rate 17, height 5\' 6"  (1.676 m), weight (!) 195.2 kg, last menstrual period 03/11/2023, SpO2 99%, unknown if currently breastfeeding.  Physical Exam:  General: alert, cooperative and no distress Chest: no respiratory distress Abdomen: soft, non-tender  Uterine Fundus: firm and at level of umbilicus Extremities: No calf swelling or tenderness  Trace edema  Recent Labs    12/05/23 2341 12/06/23 0835  HGB 10.4* 9.2*  HCT 31.6* 27.5*    Assessment/Plan: Bethany Pierce is a 23 y.o. G1P1001 s/p pLTCS at [redacted]w[redacted]d for failed IOL for cHTN.  Routine Postpartum Care: Doing well, pain well-controlled.  -- Continue routine care, lactation support  -- Contraception: POPs -- Feeding: formula  -- cHTN: Continue lasix/K. Discontinued Procardia  XL as patient is hypotensive to normotensive PP  Dispo: Plan for discharge 4/30-4/31.  Maud Sorenson, MD OB Fellow 12/06/2023 11:38 AM

## 2023-12-06 NOTE — Anesthesia Postprocedure Evaluation (Signed)
 Anesthesia Post Note  Patient: Bethany Pierce  Procedure(s) Performed: CESAREAN DELIVERY     Patient location during evaluation: PACU Anesthesia Type: Epidural Level of consciousness: oriented and awake and alert Pain management: pain level controlled Vital Signs Assessment: post-procedure vital signs reviewed and stable Respiratory status: spontaneous breathing, respiratory function stable and nonlabored ventilation Cardiovascular status: blood pressure returned to baseline and stable Postop Assessment: no headache, no backache, no apparent nausea or vomiting and epidural receding Anesthetic complications: no  No notable events documented.  Last Vitals:  Vitals:   12/06/23 0200 12/06/23 0337  BP: 124/74 115/69  Pulse: 78 76  Resp: 16 17  Temp: 36.9 C 37.1 C  SpO2: 100% 100%    Last Pain:  Vitals:   12/06/23 0337  TempSrc: Oral  PainSc: 0-No pain   Pain Goal:                Epidural/Spinal Function Cutaneous sensation: Normal sensation (12/06/23 0337), Patient able to flex knees: Yes (12/06/23 0337), Patient able to lift hips off bed: Yes (12/06/23 0337), Back pain beyond tenderness at insertion site: No (12/06/23 0337), Progressively worsening motor and/or sensory loss: No (12/06/23 0337), Bowel and/or bladder incontinence post epidural: No (12/06/23 0337)  Willian Harrow

## 2023-12-06 NOTE — Lactation Note (Signed)
 This note was copied from a baby's chart. Lactation Consultation Note  Patient Name: Bethany Pierce [id number] Date: 12/06/2023 Age:24 hours  Mom's feeding choice is formula.   Maternal Data    Feeding Nipple Type: Slow - flow  LATCH Score                    Lactation Tools Discussed/Used    Interventions    Discharge    Consult Status Consult Status: Complete    Tejas Seawood G 12/06/2023, 5:22 AM

## 2023-12-07 MED ORDER — ACETAMINOPHEN 500 MG PO TABS
1000.0000 mg | ORAL_TABLET | Freq: Three times a day (TID) | ORAL | Status: DC
  Administered 2023-12-07 – 2023-12-08 (×3): 1000 mg via ORAL
  Filled 2023-12-07 (×3): qty 2

## 2023-12-07 NOTE — Progress Notes (Signed)
 POSTPARTUM PROGRESS NOTE  Post op Day 2 Subjective:  Kalya Koepp is a 23 y.o. G1P1001 [redacted]w[redacted]d s/p pltcs.  No acute events overnight.  Pt denies problems with ambulating, voiding or po intake.  She denies nausea or vomiting.  Pain is moderately controlled.  She has had flatus. She has not had bowel movement.  Lochia Small.   Objective: Blood pressure 115/71, pulse 77, temperature 97.9 F (36.6 C), temperature source Oral, resp. rate 16, height 5\' 6"  (1.676 m), weight (!) 195.2 kg, last menstrual period 03/11/2023, SpO2 98%, unknown if currently breastfeeding.  Physical Exam:  General: alert, cooperative and no distress Lochia:normal flow Chest: CTAB Heart: RRR no m/r/g Abdomen: +BS, soft, nontender,  Uterine Fundus: not palpable 2/2 adiposity DVT Evaluation: No calf swelling or tenderness Extremities: mode LE edema  Recent Labs    12/05/23 2341 12/06/23 0835  HGB 10.4* 9.2*  HCT 31.6* 27.5*    Assessment/Plan:  ASSESSMENT: Malanna Sheely is a 23 y.o. G1P1001 [redacted]w[redacted]d s/p pltcs for arrest of dilation. Hgb and bleeding appropriate. On lovenox for dvt ppx. On levothyroxine  for hypothyroidism diagnosed during pregnancy. BPs well controlled on lasix, was not on meds for htn prior to pregnancy. Circ done today. Desires POPs for birth control. Formula feeding. Desires discharge tomorrow  Plan for discharge tomorrow   LOS: 4 days   Raymonde Calico 12/07/2023, 11:27 AM

## 2023-12-08 MED ORDER — ACETAMINOPHEN 500 MG PO TABS: 500.0000 mg | ORAL_TABLET | Freq: Four times a day (QID) | ORAL | 0 refills | Status: AC | PRN

## 2023-12-08 MED ORDER — OXYCODONE HCL 5 MG PO TABS: 5.0000 mg | ORAL_TABLET | ORAL | 0 refills | Status: AC | PRN

## 2023-12-08 MED ORDER — SERTRALINE HCL 25 MG PO TABS: 25.0000 mg | ORAL_TABLET | Freq: Every day | ORAL | 4 refills | Status: AC

## 2023-12-08 MED ORDER — IBUPROFEN 600 MG PO TABS: 600.0000 mg | ORAL_TABLET | Freq: Four times a day (QID) | ORAL | 0 refills | Status: AC

## 2023-12-08 MED ORDER — FUROSEMIDE 20 MG PO TABS: 20.0000 mg | ORAL_TABLET | Freq: Every day | ORAL | 0 refills | Status: AC

## 2023-12-08 MED ORDER — NORETHINDRONE 0.35 MG PO TABS: 1.0000 | ORAL_TABLET | Freq: Every day | ORAL | 3 refills | Status: DC

## 2023-12-08 MED ORDER — ENOXAPARIN SODIUM 100 MG/ML IJ SOSY: 100.0000 mg | PREFILLED_SYRINGE | INTRAMUSCULAR | 0 refills | Status: AC

## 2023-12-08 MED ORDER — POTASSIUM CHLORIDE CRYS ER 20 MEQ PO TBCR: 20.0000 meq | EXTENDED_RELEASE_TABLET | Freq: Every day | ORAL | 0 refills | Status: AC

## 2023-12-08 NOTE — Progress Notes (Signed)
 CSW received a consult due to FOB concerns for mental health. CSW met MOB at bedside complete a mental health assessment and offer support. CSW entered the room, introduced herself and acknowledged that FOB was present. CSW asked MOB for privacy could FOB stepout for the assesment; MOB was agreeable and FOB stepped out. CSW explained her role and the reason for the visit. MOB presented bonding/holding the infant and was  polite, easy to engage, receptive to meeting with CSW, and appeared forthcoming.  CSW inquired about MOB's mental health history. MOB reported being diagnosed with anxiety/depression in (NOV 2024) due to environmental factors. MOB reported participating in therapy with with Cquasdayshia Pauletta Boroughs in January 2025 and enjoyed her visit. MOB reported wanting additional visits with Ms sharpe and requesting medication for additional mental health support. CSW has reached out to [employer] to schedule additional therapy visits and will connect with MOB's hospital OB for medication support. CSW encouraged MOB to explore different coping mechanism like walking outside, getting ready everyday and listening to music for additional mental health support. MOB reported the delivery didn't go as expected due to having a C-section which has made the recovery difficult; CSW expressed understanding MOB's feelings. CSW encouraged MOB to take each day one day at a time and always ask for additional hands on when needed for the infant. MOB reported her supports as FOB. CSW provided education regarding the baby blues period vs. perinatal mood disorders, discussed treatment and gave resources for mental health follow up if concerns arise.  CSW recommends self-evaluation during the postpartum time period using the New Mom Checklist from Postpartum Progress and encouraged MOB to contact a medical professional if symptoms are noted at any time.  CSW assessed for safety with MOB SI/HI/DV;MOB denied all.  CSW asked  MOB has she selected a pediatrician for the infant's follow up visits; MOB said [facility]. MOB reported having all essential items for the infant including a carseat, bassinet and crib for safe sleeping. CSW provided review of Sudden Infant Death Syndrome (SIDS) precautions.  CSW identifies no further need for intervention and no barriers to discharge at this time.  Jenney Modest, Milinda Allen Clinical Social Worker [phone]

## 2023-12-12 ENCOUNTER — Encounter: Admitting: Obstetrics and Gynecology

## 2023-12-13 ENCOUNTER — Ambulatory Visit

## 2023-12-14 ENCOUNTER — Ambulatory Visit (INDEPENDENT_AMBULATORY_CARE_PROVIDER_SITE_OTHER)

## 2023-12-14 VITALS — BP 148/98 | HR 89 | Wt >= 6400 oz

## 2023-12-14 DIAGNOSIS — Z0131 Encounter for examination of blood pressure with abnormal findings: Secondary | ICD-10-CM

## 2023-12-14 DIAGNOSIS — Z5189 Encounter for other specified aftercare: Secondary | ICD-10-CM

## 2023-12-14 NOTE — Progress Notes (Signed)
..  Subjective:  Bethany Pierce is a 23 y.o. female here for BP check. Pt states that she is currently not on any BP medications and has had headaches unrelieved by Tylenol /Ibuprofen , denies any pain today. Pt is also here for wound vac removal, denies any complications.  Hypertension ROS: no medication side effects noted, no TIA's, noting some chest pains described as intermittent pain, no dyspnea on exertion, no swelling of ankles, and palpitations described as occurring intermittently before the chest pain .    Objective:  BP (!) 148/98   Pulse 89   Wt (!) 437 lb (198.2 kg)   LMP 03/11/2023   BMI 70.53 kg/m   Appearance alert, well appearing, and in no distress. General exam BP noted to be uncontrolled today in office.  Wound vac removed and incision is clean, dry, and intact with no signs of infection.  Assessment:   Blood Pressure poorly controlled.  Incision intact  Plan:  Consulted with Dr. Alto Atta in office and advised the patient to return to [facility]U related to intermittent palpitations and chest pain, pt agreed. Aaron Aas

## 2023-12-16 ENCOUNTER — Ambulatory Visit: Admitting: Cardiology

## 2023-12-19 ENCOUNTER — Encounter: Admitting: Obstetrics and Gynecology

## 2023-12-26 ENCOUNTER — Encounter: Admitting: Obstetrics and Gynecology

## 2024-01-10 ENCOUNTER — Ambulatory Visit (INDEPENDENT_AMBULATORY_CARE_PROVIDER_SITE_OTHER): Admitting: Obstetrics and Gynecology

## 2024-01-10 DIAGNOSIS — I10 Essential (primary) hypertension: Secondary | ICD-10-CM

## 2024-01-10 DIAGNOSIS — F53 Postpartum depression: Secondary | ICD-10-CM

## 2024-01-10 DIAGNOSIS — E039 Hypothyroidism, unspecified: Secondary | ICD-10-CM

## 2024-01-10 NOTE — Progress Notes (Signed)
 Post Partum Visit Note  Bethany Pierce is a 23 y.o. G42P1001 female who presents for a postpartum visit. She is 5 weeks postpartum following a primary cesarean section.  I have fully reviewed the prenatal and intrapartum course. The delivery was at 38.3 gestational weeks.  Anesthesia: epidural. Postpartum course has been good. Baby is doing well but was hospitalized for dehydration and bacterial infection. Expected discharge today or tomorrow. Baby is feeding by bottle - Similac Alimentum. Bleeding no bleeding. Bowel function is normal. Bladder function is normal. Patient is not sexually active. Contraception method is oral progesterone-only contraceptive.   Postpartum depression screening: negative, score 2.   The pregnancy intention screening data noted above was reviewed. Potential methods of contraception were discussed. The patient elected to proceed with No data recorded.   Edinburgh Postnatal Depression Scale - 01/10/24 0919       Edinburgh Postnatal Depression Scale:  In the Past 7 Days   I have been able to laugh and see the funny side of things. 0    I have looked forward with enjoyment to things. 0    I have blamed myself unnecessarily when things went wrong. 2    I have been anxious or worried for no good reason. 0    I have felt scared or panicky for no good reason. 0    Things have been getting on top of me. 0    I have been so unhappy that I have had difficulty sleeping. 0    I have felt sad or miserable. 0    I have been so unhappy that I have been crying. 0    The thought of harming myself has occurred to me. 0    Edinburgh Postnatal Depression Scale Total 2             Health Maintenance Due  Topic Date Due   HPV VACCINES (1 - 3-dose series) Never done   COVID-19 Vaccine (1 - 2024-25 season) Never done    The following portions of the patient's history were reviewed and updated as appropriate: allergies, current medications, past family history, past  medical history, past social history, past surgical history, and problem list.  Review of Systems Pertinent items are noted in HPI.  Objective:  BP 129/83   Pulse 86   Ht 5\' 6"  (1.676 m)   Wt (!) 412 lb (186.9 kg)   LMP 01/02/2024   Breastfeeding No   BMI 66.50 kg/m    General:  alert, cooperative, and no distress   Breasts:  not indicated  Lungs: Nonlabored breathing  Heart:  Regular rate noted  Abdomen: Soft,nontender, nondistended   Wound Clean/dry/intact  GU exam:  not indicated       Assessment:  1. Postpartum exam (Primary)  2. Hypothyroidism, unspecified type - TSH Rfx on Abnormal to Free T4  3. Chronic hypertension F/u with PCP BP appropriate today without medication, continue home BP monitoring  4. Postpartum depression Prescribed zoloft  in hospital, mood good, interested in [facility] referral - Amb ref to [facility]   Plan:   Essential components of care per ACOG recommendations:  1.  Mood and well being: Patient with negative depression screening today. Reviewed local resources for support.  - Patient tobacco use? No.   - hx of drug use? No.    2. Infant care and feeding:  -Patient currently breastmilk feeding? No.  -Social determinants of health (SDOH) reviewed in EPIC. No concerns   3.  Sexuality, contraception and birth spacing - Patient does not want a pregnancy in the next year.   - Reviewed reproductive life planning. Reviewed contraceptive methods based on pt preferences and effectiveness.  Patient desired Oral Contraceptive today.  Has rx for POPs  4. Sleep and fatigue -Encouraged family/partner/community support of 4 hrs of uninterrupted sleep to help with mood and fatigue  5. Physical Recovery  - Discussed patients delivery and complications. She describes her labor as mixed. - Patient had a C-section failure to progress.  - Patient is safe to resume physical and sexual activity  6.  Health Maintenance - HM due items  addressed. TSH today. - Last pap smear  Diagnosis  Date Value Ref Range Status  06/06/2023   Final   - Negative for intraepithelial lesion or malignancy (NILM)   Pap smear not done at today's visit.  -Breast Cancer screening indicated? No.   7. Chronic Disease/Pregnancy Condition follow up: Hypertension and Hypothyroidism  - PCP follow up  Marci Setter, MD Center for [facility], [facility]

## 2024-01-11 ENCOUNTER — Ambulatory Visit: Payer: Self-pay | Admitting: Obstetrics and Gynecology

## 2024-01-11 ENCOUNTER — Other Ambulatory Visit: Payer: Self-pay | Admitting: Obstetrics and Gynecology

## 2024-01-11 DIAGNOSIS — E039 Hypothyroidism, unspecified: Secondary | ICD-10-CM

## 2024-01-11 LAB — T4F: T4,Free (Direct): 0.95 ng/dL (ref 0.82–1.77)

## 2024-01-11 LAB — TSH RFX ON ABNORMAL TO FREE T4: TSH: 5.57 u[IU]/mL — ABNORMAL HIGH (ref 0.450–4.500)

## 2024-01-11 MED ORDER — LEVOTHYROXINE SODIUM 75 MCG PO TABS: 75.0000 ug | ORAL_TABLET | Freq: Every day | ORAL | 2 refills | Status: AC

## 2024-02-06 ENCOUNTER — Encounter: Payer: Self-pay | Admitting: Obstetrics and Gynecology

## 2024-02-29 ENCOUNTER — Ambulatory Visit: Admitting: Obstetrics and Gynecology

## 2024-03-09 ENCOUNTER — Ambulatory Visit: Admitting: Licensed Clinical Social Worker

## 2024-03-09 DIAGNOSIS — Z91199 Patient's noncompliance with other medical treatment and regimen due to unspecified reason: Secondary | ICD-10-CM

## 2024-03-19 NOTE — BH Specialist Note (Signed)
 Patient no showed for today's visit.

## 2024-07-11 ENCOUNTER — Ambulatory Visit: Admitting: Nurse Practitioner

## 2024-08-16 ENCOUNTER — Encounter: Payer: Self-pay | Admitting: Radiology

## 2024-08-16 ENCOUNTER — Ambulatory Visit: Admitting: Radiology

## 2024-08-16 VITALS — BP 128/78 | HR 90 | Wt >= 6400 oz

## 2024-08-16 DIAGNOSIS — N920 Excessive and frequent menstruation with regular cycle: Secondary | ICD-10-CM | POA: Diagnosis not present

## 2024-08-16 MED ORDER — SLYND 4 MG PO TABS: 1.0000 | ORAL_TABLET | Freq: Every day | ORAL | 0 refills | Status: DC

## 2024-08-16 NOTE — Progress Notes (Signed)
" ° °  Bethany Pierce Jan 25, 2001 [id number]   History:  24 y.o. G1P1 presents with c/o heavy, painful, irregular periods since c/s 11/2023.Has been having 2-3 periods a month, heavy. BC: condoms (PCP advised her to stopped Micronor  2 months ago, due to irregular bleeding).   Gynecologic History Patient's last menstrual period was 08/03/2024 (exact date).   Contraception/Family planning: condoms Sexually active: yes   Obstetric History OB History  Gravida Para Term Preterm AB Living  1 1 1  0 0 1  SAB IAB Ectopic Multiple Live Births  0 0 0 0 1    # Outcome Date GA Lbr Len/2nd Weight Sex Type Anes PTL Lv  1 Term 12/05/23 [redacted]w[redacted]d  7 lb 5 oz (3.317 kg) M CS-LTranv EPI  LIV       05/30/2023    8:46 AM  Depression screen PHQ 2/9  Decreased Interest 0  Down, Depressed, Hopeless 1  PHQ - 2 Score 1  Altered sleeping 0  Tired, decreased energy 0  Change in appetite 0  Feeling bad or failure about yourself  0  Trouble concentrating 0  Moving slowly or fidgety/restless 0  Suicidal thoughts 0  PHQ-9 Score 1      Data saved with a previous flowsheet row definition     The following portions of the patient's history were reviewed and updated as appropriate: allergies, current medications, past family history, past medical history, past social history, past surgical history, and problem list.  ROS  Past medical history, past surgical history, family history and social history were all reviewed and documented in the EPIC chart.  Exam:  Vitals:   08/16/24 1112  BP: 128/78  Pulse: 90  SpO2: 98%  Weight: (!) 421 lb (191 kg)   Body mass index is 67.95 kg/m.  Physical Exam Exam conducted with a chaperone present.  Constitutional:      Appearance: Normal appearance. She is morbidly obese.  Pulmonary:     Effort: Pulmonary effort is normal.  Genitourinary:    General: Normal vulva.     Vagina: Normal. No vaginal discharge, erythema, tenderness, bleeding or lesions.      Cervix: Normal.     Comments: Unable to assess uterus and ovaries due to large habitus Neurological:     Mental Status: She is alert.  Psychiatric:        Mood and Affect: Mood normal.        Thought Content: Thought content normal.        Judgment: Judgment normal.      Bethany Pierce, CMA present for exam  Assessment/Plan:   1. Menorrhagia with regular cycle (Primary) Will start Slynd  to help with bleeding. Schedule u/s. Consider Mirena IUD Was started on Wegovy by PCP. Discussed once weight is down, bleeding profile may improve.Continue condom use. - US  PELVIS TRANSVAGINAL NON-OB (TV ONLY); Future - Drospirenone  (SLYND ) 4 MG TABS; Take 1 tablet (4 mg total) by mouth daily.  Dispense: 84 tablet; Refill: 0 NAL NON-OB (TV ONLY); Future    Return for u/s then JC.  Bethany Pierce WHNP-BC 11:49 AM 08/16/2024 "

## 2024-08-28 ENCOUNTER — Encounter: Payer: Self-pay | Admitting: Radiology

## 2024-08-28 ENCOUNTER — Ambulatory Visit: Admitting: Radiology

## 2024-08-28 ENCOUNTER — Telehealth: Payer: Self-pay

## 2024-08-28 ENCOUNTER — Ambulatory Visit (INDEPENDENT_AMBULATORY_CARE_PROVIDER_SITE_OTHER)

## 2024-08-28 VITALS — BP 122/86 | HR 84 | Wt >= 6400 oz

## 2024-08-28 DIAGNOSIS — N920 Excessive and frequent menstruation with regular cycle: Secondary | ICD-10-CM

## 2024-08-28 DIAGNOSIS — Z6841 Body Mass Index (BMI) 40.0 and over, adult: Secondary | ICD-10-CM

## 2024-08-28 DIAGNOSIS — Z30011 Encounter for initial prescription of contraceptive pills: Secondary | ICD-10-CM

## 2024-08-28 MED ORDER — WEGOVY 0.25 MG/0.5ML ~~LOC~~ SOAJ: 0.2500 mg | SUBCUTANEOUS | 0 refills | Status: AC

## 2024-08-28 MED ORDER — SLYND 4 MG PO TABS: 1.0000 | ORAL_TABLET | Freq: Every day | ORAL | 0 refills | Status: AC

## 2024-08-28 NOTE — Telephone Encounter (Signed)
 Prior authorization request received from covermymeds for Wegovy  0.25 mg.  PA initiated.  Patient notified. KEY: [id number]

## 2024-08-28 NOTE — Telephone Encounter (Signed)
 PA for Wegovy  0.25 mg was approved.  Patient was notified.

## 2024-08-28 NOTE — Progress Notes (Signed)
" ° °  Bethany Pierce [employer] February 01, 2001 [id number]   History:  24 y.o. G1P0 presents for follow up u/s for menorrhagia and for weight management. PCP was not able to get wegovy  covered. Patient has been trying to lose weight with exercise and low calorie diet however continues to gain. Has gained 6lbs since I lost saw her 12 days ago/  Gynecologic History Patient's last menstrual period was 08/03/2024 (exact date).   Obstetric History OB History  Gravida Para Term Preterm AB Living  1 1 1  0 0 1  SAB IAB Ectopic Multiple Live Births  0 0 0 0 1    # Outcome Date GA Lbr Len/2nd Weight Sex Type Anes PTL Lv  1 Term 12/05/23 [redacted]w[redacted]d  7 lb 5 oz (3.317 kg) M CS-LTranv EPI  LIV     Review of Systems  All other systems reviewed and are negative.   Past medical history, past surgical history, family history and social history were all reviewed and documented in the EPIC chart.  Exam:  Vitals:   08/28/24 0945  BP: 122/86  Pulse: 84  SpO2: 96%  Weight: (!) 427 lb (193.7 kg)   Body mass index is 68.92 kg/m.  Physical Exam Vitals reviewed.  Constitutional:      Appearance: Normal appearance. She is obese.  Pulmonary:     Effort: Pulmonary effort is normal.  Neurological:     Mental Status: She is alert.  Psychiatric:        Mood and Affect: Mood normal.        Thought Content: Thought content normal.        Judgment: Judgment normal.    Narrative & Impression Indication:menorrhagia   Vaginal ultrasound   Anteverted uterus  Normal size and shape 10.41 x 4.13 x 4.06cm No myometrial masses   Symmetrical endometrium 6.36mm, avascular   Right ovary normal size and follicle patter Left ovary not seen   Limited exam due to BMI   Impression:Limited exam however no gyn abnormalities        Exam Ended: 08/28/24 09:39 Last Resulted: 08/28/24 10:27      Current weight:427 First goal:400 Overall weight loss goal:350  Assessment/Plan:   1. BMI 60.0-69.9, adult (HCC)  (Primary) - semaglutide -weight management (WEGOVY ) 0.25 MG/0.5ML SOAJ SQ injection; Inject 0.25 mg into the skin once a week.  Dispense: 2 mL; Refill: 0  2. Morbid obesity (HCC) - semaglutide -weight management (WEGOVY ) 0.25 MG/0.5ML SOAJ SQ injection; Inject 0.25 mg into the skin once a week.  Dispense: 2 mL; Refill: 0  3. Menorrhagia with regular cycle Reassured regarding u/s findings Discussed changing to Slynd  or trying an IUD, would like to try Slynd  - Drospirenone  (SLYND ) 4 MG TABS; Take 1 tablet (4 mg total) by mouth daily.  Dispense: 84 tablet; Refill: 0  4. Oral contraception initiation - Drospirenone  (SLYND ) 4 MG TABS; Take 1 tablet (4 mg total) by mouth daily.  Dispense: 84 tablet; Refill: 0    Risks and benefits discussed. Continue to focus on protein and fiber. Small frequent meals. Adequate hydration and electrolyte replacement. Will continue to exercise for at least a week including weight bearing exercise.   Return in about 4 weeks (around 09/25/2024) for Med Follow-up.  GINETTE SHASTA NOVAK WHNP-BC 10:27 AM 08/28/2024  "

## 2024-09-25 ENCOUNTER — Ambulatory Visit: Admitting: Radiology

## 2024-10-10 ENCOUNTER — Ambulatory Visit: Admitting: Radiology
